# Patient Record
Sex: Female | Born: 1935
Health system: Southern US, Community
[De-identification: ages and names within clinical notes are randomized; demographics above are authoritative.]

## PROBLEM LIST (undated history)

## (undated) DIAGNOSIS — M1990 Unspecified osteoarthritis, unspecified site: Secondary | ICD-10-CM

## (undated) DIAGNOSIS — K219 Gastro-esophageal reflux disease without esophagitis: Secondary | ICD-10-CM

## (undated) DIAGNOSIS — I10 Essential (primary) hypertension: Secondary | ICD-10-CM

## (undated) DIAGNOSIS — F32A Depression, unspecified: Secondary | ICD-10-CM

## (undated) DIAGNOSIS — G5600 Carpal tunnel syndrome, unspecified upper limb: Secondary | ICD-10-CM

## (undated) DIAGNOSIS — F329 Major depressive disorder, single episode, unspecified: Secondary | ICD-10-CM

## (undated) DIAGNOSIS — R351 Nocturia: Secondary | ICD-10-CM

## (undated) HISTORY — PX: APPENDECTOMY: SHX54

## (undated) HISTORY — PX: CATARACT EXTRACTION: SUR2

## (undated) HISTORY — PX: TOTAL HIP ARTHROPLASTY: SHX124

## (undated) HISTORY — PX: CHOLECYSTECTOMY: SHX55

## (undated) HISTORY — PX: TONSILLECTOMY AND ADENOIDECTOMY: SUR1326

## (undated) HISTORY — DX: Unspecified osteoarthritis, unspecified site: M19.90

## (undated) HISTORY — DX: Essential (primary) hypertension: I10

## (undated) HISTORY — PX: TOTAL KNEE ARTHROPLASTY: SHX125

## (undated) HISTORY — DX: Gastro-esophageal reflux disease without esophagitis: K21.9

---

## 1999-07-14 ENCOUNTER — Encounter: Payer: Self-pay | Admitting: Emergency Medicine

## 1999-07-14 ENCOUNTER — Emergency Department (HOSPITAL_COMMUNITY): Admission: EM | Admit: 1999-07-14 | Discharge: 1999-07-14 | Payer: Self-pay | Admitting: Emergency Medicine

## 2002-03-02 ENCOUNTER — Encounter: Payer: Self-pay | Admitting: Family Medicine

## 2002-03-02 ENCOUNTER — Encounter: Admission: RE | Admit: 2002-03-02 | Discharge: 2002-03-02 | Payer: Self-pay | Admitting: Family Medicine

## 2002-06-19 ENCOUNTER — Encounter: Admission: RE | Admit: 2002-06-19 | Discharge: 2002-06-19 | Payer: Self-pay | Admitting: Family Medicine

## 2002-06-19 ENCOUNTER — Encounter: Payer: Self-pay | Admitting: Family Medicine

## 2006-08-17 ENCOUNTER — Encounter: Admission: RE | Admit: 2006-08-17 | Discharge: 2006-08-17 | Payer: Self-pay | Admitting: Family Medicine

## 2010-02-12 ENCOUNTER — Inpatient Hospital Stay (HOSPITAL_COMMUNITY): Admission: RE | Admit: 2010-02-12 | Discharge: 2010-02-18 | Payer: Self-pay | Admitting: Orthopedic Surgery

## 2010-08-12 ENCOUNTER — Ambulatory Visit (HOSPITAL_COMMUNITY): Admission: RE | Admit: 2010-08-12 | Discharge: 2010-08-12 | Payer: Self-pay | Admitting: Orthopedic Surgery

## 2010-12-29 LAB — COMPREHENSIVE METABOLIC PANEL
ALT: 21 U/L (ref 0–35)
AST: 19 U/L (ref 0–37)
Alkaline Phosphatase: 76 U/L (ref 39–117)
CO2: 29 mEq/L (ref 19–32)
Calcium: 9.8 mg/dL (ref 8.4–10.5)
GFR calc Af Amer: >60 mL/min (ref >60)
Glucose, Bld: 108 mg/dL — ABNORMAL HIGH (ref 70–99)
Potassium: 3.9 mEq/L (ref 3.5–5.1)
Sodium: 139 mEq/L (ref 135–145)
Total Protein: 7.7 g/dL (ref 6.0–8.3)

## 2010-12-29 LAB — CBC
HCT: 28.3 % — ABNORMAL LOW (ref 36.0–46.0)
HCT: 28.9 % — ABNORMAL LOW (ref 36.0–46.0)
HCT: 32.2 % — ABNORMAL LOW (ref 36.0–46.0)
Hemoglobin: 9.7 g/dL — ABNORMAL LOW (ref 12.0–15.0)
Hemoglobin: 9.9 g/dL — ABNORMAL LOW (ref 12.0–15.0)
Hemoglobin: 9.9 g/dL — ABNORMAL LOW (ref 12.0–15.0)
Hemoglobin: 14.3 g/dL (ref 12.0–15.0)
MCHC: 33.3 g/dL (ref 30.0–36.0)
MCHC: 33.8 g/dL (ref 30.0–36.0)
MCHC: 34.3 g/dL (ref 30.0–36.0)
MCHC: 34 g/dL (ref 30.0–36.0)
MCV: 94.9 fL (ref 78.0–100.0)
MCV: 94.9 fL (ref 78.0–100.0)
MCV: 95.3 fL (ref 78.0–100.0)
MCV: 95.3 fL (ref 78.0–100.0)
MCV: 96.8 fL (ref 78.0–100.0)
Platelets: 177 10*3/uL (ref 150–400)
Platelets: 202 10*3/uL (ref 150–400)
Platelets: 212 10*3/uL (ref 150–400)
Platelets: 252 10*3/uL (ref 150–400)
RBC: 2.97 MIL/uL — ABNORMAL LOW (ref 3.87–5.11)
RBC: 3 MIL/uL — ABNORMAL LOW (ref 3.87–5.11)
RBC: 3.08 MIL/uL — ABNORMAL LOW (ref 3.87–5.11)
RBC: 3.39 MIL/uL — ABNORMAL LOW (ref 3.87–5.11)
RBC: 4.46 MIL/uL (ref 3.87–5.11)
RDW: 13.8 % (ref 11.5–15.5)
RDW: 13.8 % (ref 11.5–15.5)
WBC: 10.7 10*3/uL — ABNORMAL HIGH (ref 4.0–10.5)
WBC: 11.1 10*3/uL — ABNORMAL HIGH (ref 4.0–10.5)
WBC: 11.3 10*3/uL — ABNORMAL HIGH (ref 4.0–10.5)
WBC: 11.9 10*3/uL — ABNORMAL HIGH (ref 4.0–10.5)

## 2010-12-29 LAB — BASIC METABOLIC PANEL
BUN: 4 mg/dL — ABNORMAL LOW (ref 6–23)
BUN: 5 mg/dL — ABNORMAL LOW (ref 6–23)
BUN: 8 mg/dL (ref 6–23)
CO2: 27 mEq/L (ref 19–32)
CO2: 29 mEq/L (ref 19–32)
CO2: 33 mEq/L — ABNORMAL HIGH (ref 19–32)
CO2: 35 mEq/L — ABNORMAL HIGH (ref 19–32)
Calcium: 8.1 mg/dL — ABNORMAL LOW (ref 8.4–10.5)
Calcium: 8.5 mg/dL (ref 8.4–10.5)
Calcium: 8.5 mg/dL (ref 8.4–10.5)
Chloride: 104 mEq/L (ref 96–112)
Chloride: 104 mEq/L (ref 96–112)
Creatinine, Ser: 0.63 mg/dL (ref 0.4–1.2)
Creatinine, Ser: 0.7 mg/dL (ref 0.4–1.2)
GFR calc Af Amer: >60 mL/min (ref >60)
GFR calc Af Amer: >60 mL/min (ref >60)
GFR calc non Af Amer: 59 mL/min — ABNORMAL LOW (ref >60)
GFR calc non Af Amer: >60 mL/min (ref >60)
GFR calc non Af Amer: >60 mL/min (ref >60)
GFR calc non Af Amer: >60 mL/min (ref >60)
GFR calc non Af Amer: >60 mL/min (ref >60)
Glucose, Bld: 121 mg/dL — ABNORMAL HIGH (ref 70–99)
Glucose, Bld: 123 mg/dL — ABNORMAL HIGH (ref 70–99)
Glucose, Bld: 179 mg/dL — ABNORMAL HIGH (ref 70–99)
Potassium: 3.8 mEq/L (ref 3.5–5.1)
Potassium: 4.5 mEq/L (ref 3.5–5.1)
Sodium: 137 mEq/L (ref 135–145)
Sodium: 140 mEq/L (ref 135–145)

## 2010-12-29 LAB — URINALYSIS, ROUTINE W REFLEX MICROSCOPIC
Bilirubin Urine: NEGATIVE
Glucose, UA: NEGATIVE mg/dL
Ketones, ur: 15 mg/dL — AB
Protein, ur: NEGATIVE mg/dL
pH: 7 (ref 5.0–8.0)

## 2010-12-29 LAB — PROTIME-INR
INR: 1.15 (ref 0.00–1.49)
INR: 1.75 — ABNORMAL HIGH (ref 0.00–1.49)
INR: 2.73 — ABNORMAL HIGH (ref 0.00–1.49)
Prothrombin Time: 14.6 seconds (ref 11.6–15.2)
Prothrombin Time: 20.3 seconds — ABNORMAL HIGH (ref 11.6–15.2)
Prothrombin Time: 24.2 seconds — ABNORMAL HIGH (ref 11.6–15.2)
Prothrombin Time: 24.7 seconds — ABNORMAL HIGH (ref 11.6–15.2)
Prothrombin Time: 12.8 seconds (ref 11.6–15.2)

## 2010-12-29 LAB — AMYLASE: Amylase: 21 U/L (ref 0–105)

## 2010-12-29 LAB — URINE MICROSCOPIC-ADD ON

## 2010-12-29 LAB — TYPE AND SCREEN
ABO/RH(D): B POS
Antibody Screen: NEGATIVE

## 2011-06-18 IMAGING — CR DG HIP COMPLETE 2+V*R*
3 series · 3 of 3 positions shown · non-contrast
Comparison: None.

CLINICAL DATA: Preop for osteoarthritis

RIGHT HIP - COMPLETE 2+ VIEW

[t pelvis a.p. *]
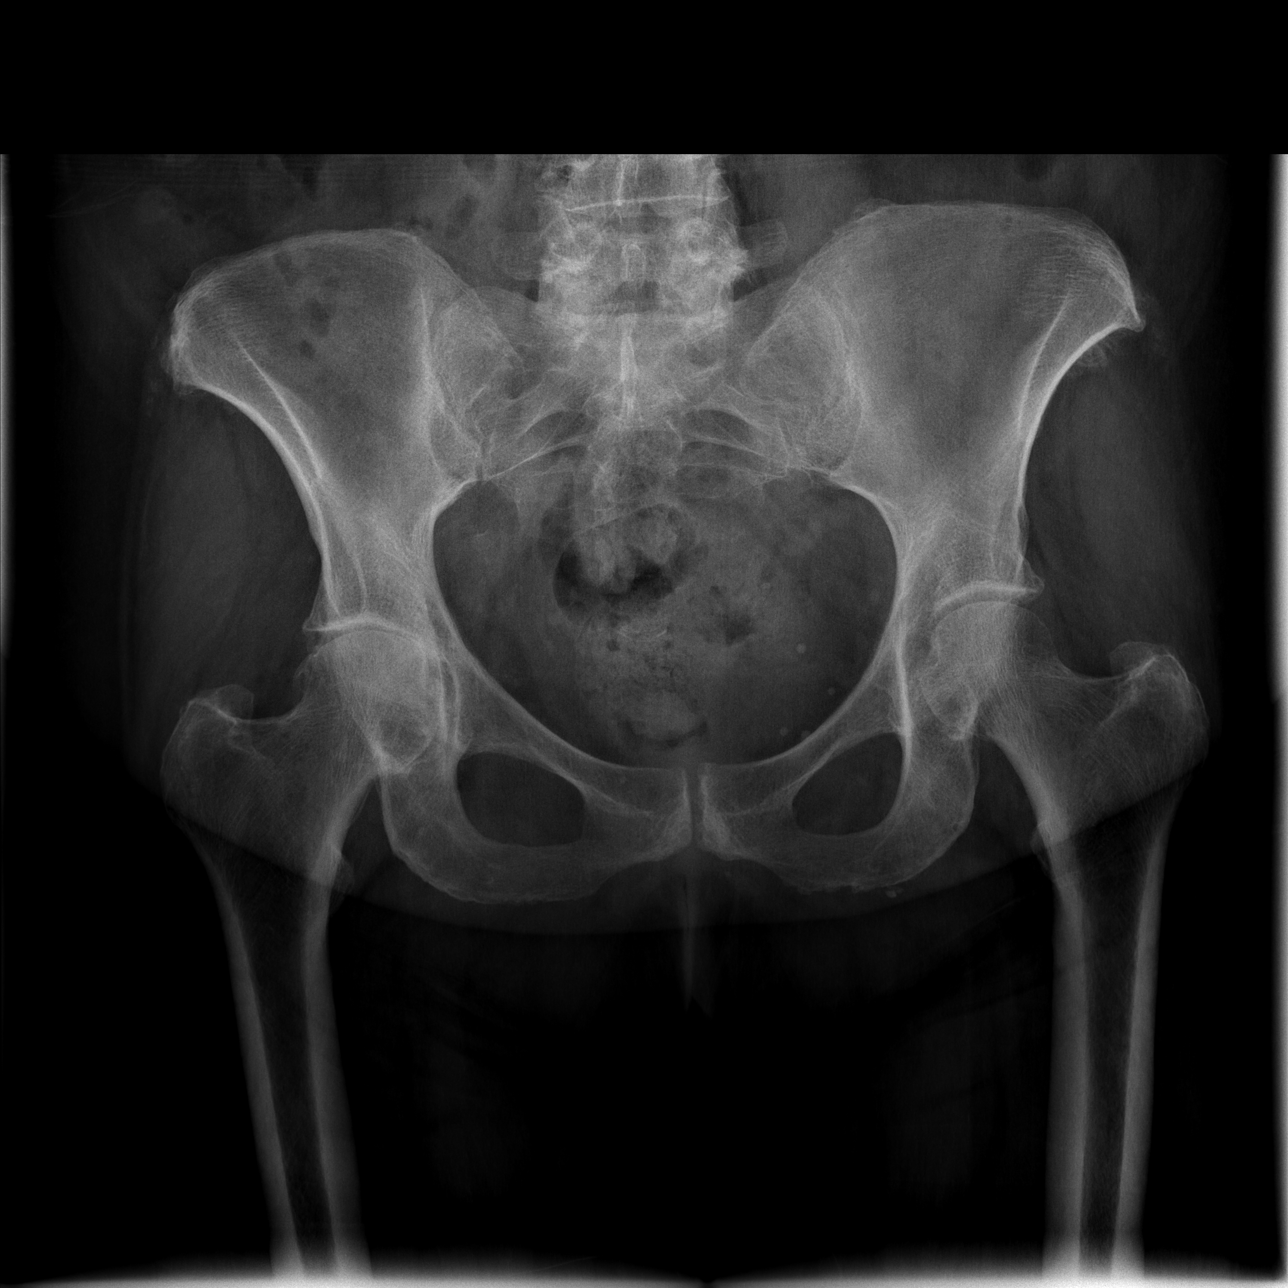

[t hip ap right *]
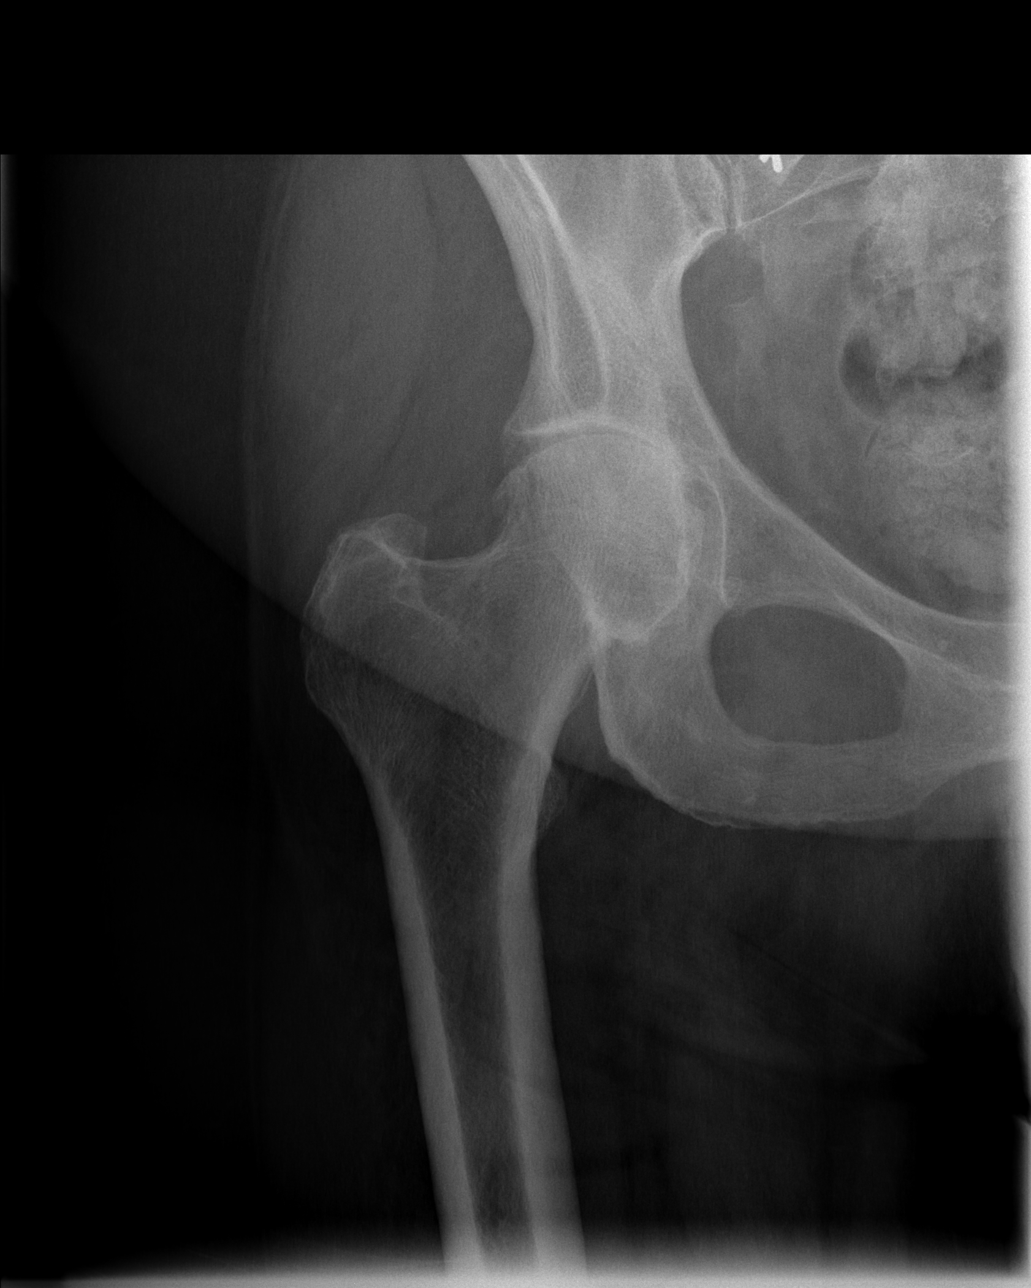

[t hip frog leg right *]
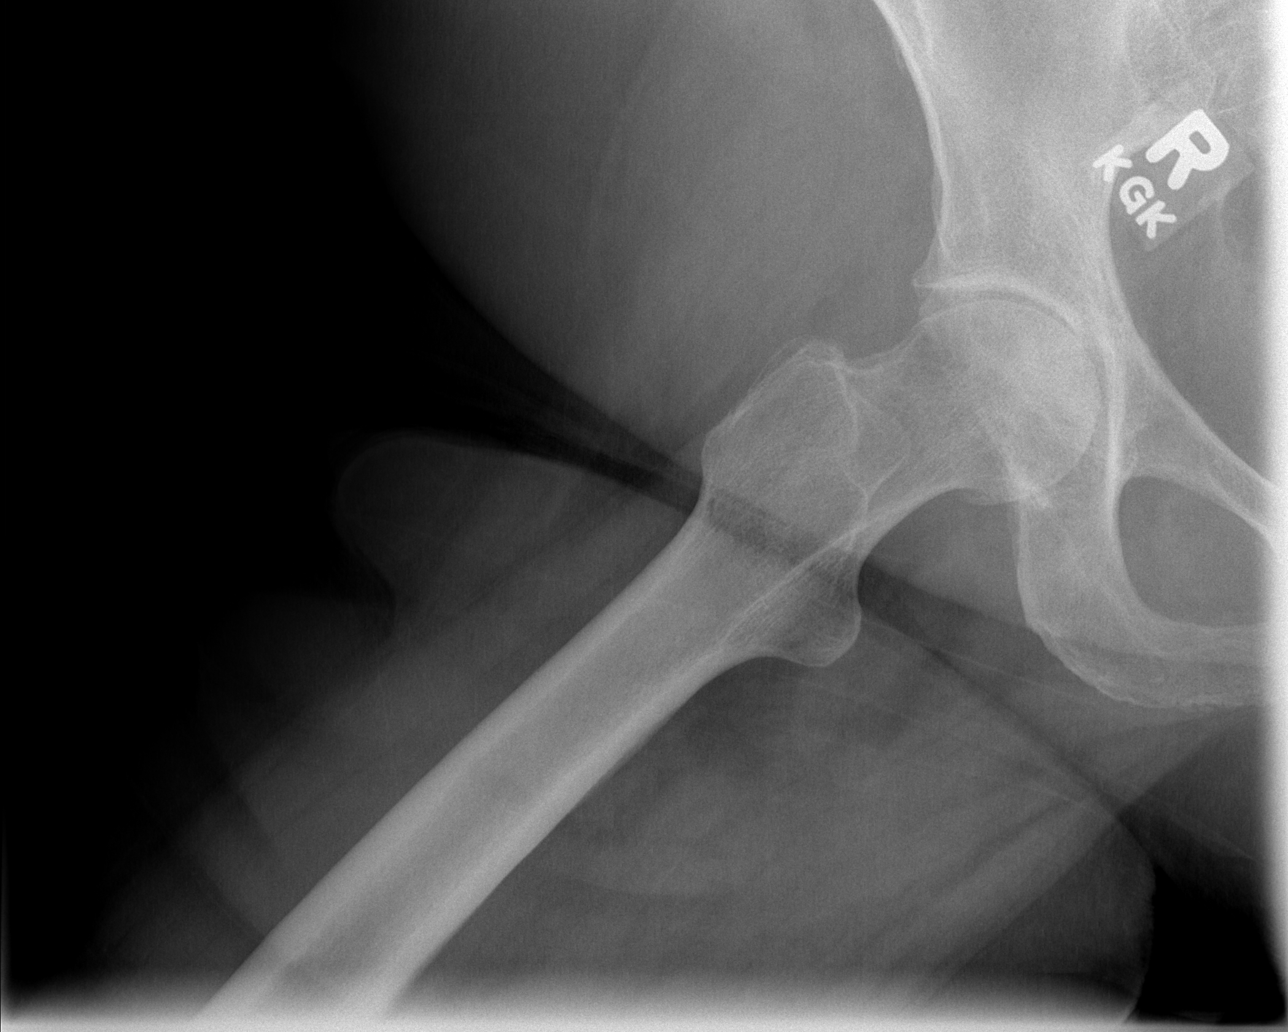

[3 of 3 positions shown; findings below may reference images not displayed]

FINDINGS: Three views of the right hip submitted.  No acute
fracture or subluxation.  Diffuse narrowing of the joint space is
noted.  Mild spurring of the right femoral head.  Minimal
remodeling of the right femoral head is noted.  Pelvic phleboliths
are noted.
IMPRESSION: No acute fracture or subluxation.  Diffuse narrowing of the joint
space right hip joint.  Mild spurring of the right femoral head.

## 2011-06-26 IMAGING — CR DG HIP 1V PORT*R*
1 series · 1 of 1 positions shown · non-contrast
Comparison: 02/04/2010

CLINICAL DATA: Right hip replacement.

PORTABLE RIGHT HIP - 1 VIEW

[view not recorded]
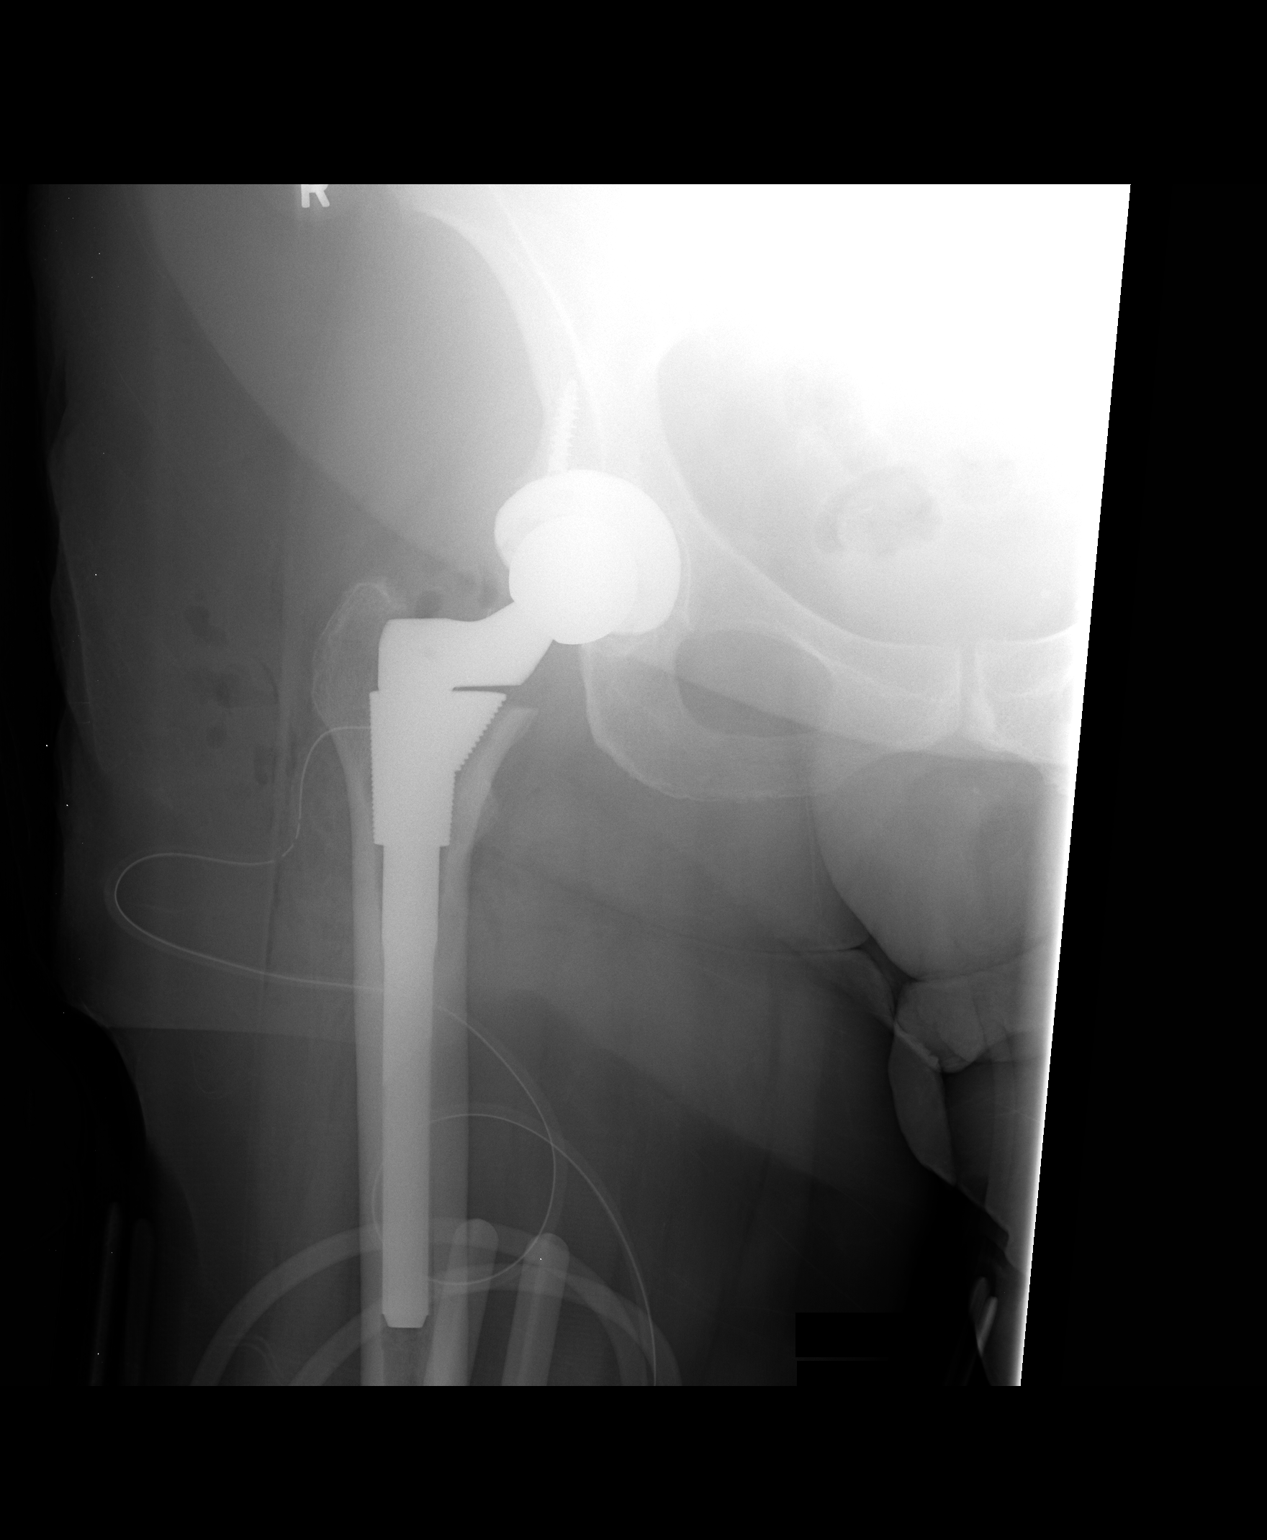

[1 of 1 positions shown; findings below may reference images not displayed]

FINDINGS: Right total hip replacement is in satisfactory position
and alignment.  There is no fracture or acute complication.
IMPRESSION: Satisfactory right hip replacement.

## 2014-11-19 ENCOUNTER — Ambulatory Visit: Payer: Self-pay | Admitting: Orthopedic Surgery

## 2014-11-19 NOTE — Progress Notes (Signed)
Preoperative surgical orders have been place into the Epic hospital system for Melissa Harris on 11/19/2014, 11:51 AM  by Patrica DuelPERKINS, ALEXZANDREW for surgery on 12-20-2014.  Preop Total Knee orders including Experal, IV Tylenol, and IV Decadron as long as there are no contraindications to the above medications. Avel Peacerew Perkins, PA-C

## 2014-11-28 ENCOUNTER — Telehealth: Payer: Self-pay | Admitting: Cardiology

## 2014-11-28 ENCOUNTER — Encounter: Payer: Self-pay | Admitting: Cardiology

## 2014-11-28 ENCOUNTER — Ambulatory Visit (INDEPENDENT_AMBULATORY_CARE_PROVIDER_SITE_OTHER): Payer: Medicare Other | Admitting: Cardiology

## 2014-11-28 VITALS — BP 140/80 | HR 68 | Ht 63.0 in | Wt 182.0 lb

## 2014-11-28 DIAGNOSIS — R002 Palpitations: Secondary | ICD-10-CM | POA: Insufficient documentation

## 2014-11-28 DIAGNOSIS — Z01810 Encounter for preprocedural cardiovascular examination: Secondary | ICD-10-CM

## 2014-11-28 NOTE — Patient Instructions (Signed)
Your physician recommends that you schedule a follow-up appointment in: as needed with Dr. Hochrein  

## 2014-11-28 NOTE — Telephone Encounter (Signed)
[facility] for appointment with Dr Antoine PocheHochrein on 11/28/14.  [facility] Dr Lindaann SloughHochreins schedule 11/28/14. lp

## 2014-11-28 NOTE — Progress Notes (Signed)
Cardiology Office Note   Date:  11/28/2014   ID:  [name], DOB 1928-04-19, MRN [id number]  PCP:  No primary care provider on file.  Cardiologist:   Rollene RotundaJames Francisca Harbuck, MD   No chief complaint on file.     History of Present Illness: Melissa Harris is a 81 y.o. female who presents for preoperative evaluation prior to orthopedic surgery. She has no past cardiac history.  She however a couple of months ago felt a strong heartbeat in her throat. This happened when she was lying down at night. It occurred for about 3 hours. It was not rapid or irregular but she felt something pulsing up her neck. It came and went spontaneously. She has never had this before and not since.  She denies any chest pressure, neck or arm discomfort. She doesn't have a shortness of breath, PND or orthopnea. She doesn't have any presyncope or syncope. She gets around slowly with her orthopedic problems but she exercises 3 times per week in her community with other retirees and she has no limitations with this.   Past Medical History  Diagnosis Date  . Osteoarthritis   . GERD (gastroesophageal reflux disease)     Past Surgical History  Procedure Laterality Date  . Total hip arthroplasty Right   . Total knee arthroplasty Right   . Cholecystectomy    . Appendectomy    . Tonsillectomy and adenoidectomy    . Cataract extraction       Current Outpatient Prescriptions  Medication Sig Dispense Refill  . aspirin 81 MG tablet Take 81 mg by mouth daily.    . calcium carbonate (TUMS - DOSED IN MG ELEMENTAL CALCIUM) 500 MG chewable tablet Chew 1 tablet by mouth 3 (three) times daily.    . Cholecalciferol (VITAMIN D3) 1000 UNITS CAPS Take 2,000 Int'l Units by mouth daily.    Marland Kitchen. latanoprost (XALATAN) 0.005 % ophthalmic solution 1 drop at bedtime.    . metoprolol tartrate (LOPRESSOR) 25 MG tablet Take 25 mg by mouth 3 (three) times daily. 1 in the morning and two in the evening    . Multiple Vitamin (MULTIVITAMIN)  capsule Take 1 capsule by mouth daily.    . naproxen sodium (ANAPROX) 220 MG tablet Take 220 mg by mouth 2 (two) times daily with a meal.     No current facility-administered medications for this visit.    Allergies:   Review of patient's allergies indicates not on file.    Social History:  The patient  reports that she has never smoked. She does not have any smokeless tobacco history on file.   Family History:  The patient's family history includes Dementia in an other family member.    ROS:  Please see the history of present illness.   Otherwise, review of systems are positive for decreased hearing, reflux, mild difficulty swallowing, occasional urinary incontinence..   All other systems are reviewed and negative.    PHYSICAL EXAM: VS:  BP 140/80 mmHg  Pulse 68  Ht 5\' 3"  (1.6 m)  Wt 182 lb (82.555 kg)  BMI 32.25 kg/m2 , BMI Body mass index is 32.25 kg/(m^2). GENERAL:  Well appearing HEENT:  Pupils equal round and reactive, fundi not visualized, oral mucosa unremarkable NECK:  No jugular venous distention, waveform within normal limits, carotid upstroke brisk and symmetric, no bruits, no thyromegaly LYMPHATICS:  No cervical, inguinal adenopathy LUNGS:  Clear to auscultation bilaterally BACK:  No CVA tenderness CHEST:  Unremarkable HEART:  PMI not displaced or sustained,S1 and S2 within normal limits, no S3, no S4, no clicks, no rubs, 2 out of 6 right upper sternal border nonradiating systolic murmur, no diastolic murmurs ABD:  Flat, positive bowel sounds normal in frequency in pitch, no bruits, no rebound, no guarding, no midline pulsatile mass, no hepatomegaly, no splenomegaly EXT:  2 plus pulses throughout, no edema, no cyanosis no clubbing SKIN:  No rashes no nodules NEURO:  Cranial nerves II through XII grossly intact, motor grossly intact throughout PSYCH:  Cognitively intact, oriented to person place and time    EKG:  EKG is ordered today. The ekg ordered today  demonstrates sinus rhythm, rate 68, leftward axis, RSR prime V1 and V2, nonspecific diffuse T wave flattening     Wt Readings from Last 3 Encounters:  11/28/14 182 lb (82.555 kg)      Other studies Reviewed: Additional studies/ records that were reviewed today include: Outside office note. Review of the above records demonstrates:  Please see elsewhere in the note.     ASSESSMENT AND PLAN:  PREOP:  The patient has a high functional status. She is not going for a high risk procedure. There are no high risk symptoms or findings. Therefore, based on ACC/AHA guidelines, the patient would be at acceptable risk for the planned procedure without further cardiovascular testing.  PALPITATIONS:  The patient describes a one time episode of some vague palpitations but otherwise has no symptoms. Therefore, no further cardiovascular testing is indicated at this point. However, be happy to reevaluate should she have recurrence of the symptoms.  HTN:  She reports that her blood pressure is well controlled. She actually does not report this as a previous diagnosis. I did review some blood pressures and though it fluctuates somewhat the average seems to be about 130 - 140 systolic.   Current medicines are reviewed at length with the patient today.  The patient does not have concerns regarding medicines.  The following changes have been made:  no change  Labs/ tests ordered today include:   Orders Placed This Encounter  Procedures  . EKG 12-Lead     Disposition:   FU with me as needed.     Signed, Rollene Rotunda, MD  11/28/2014 4:21 PM     [facility]

## 2014-12-09 NOTE — Patient Instructions (Addendum)
Melissa Harris  12/09/2014   Your procedure is scheduled on: 12/20/2014    [facility] and follow signs to               Short Stay Center at     0515 AM.  Call this number if you have problems the morning of surgery [phone]   Remember:  Do not eat food or drink liquids :After Midnight.     Take these medicines the morning of surgery with A SIP OF WATER: Metoprolol ( Lopressor )                                You may not have any metal on your body including hair pins and              piercings  Do not wear jewelry, make-up, lotions, powders or perfumes., deodorant.               Do not wear nail polish.  Do not shave  48 hours prior to surgery.                Do not bring valuables to the hospital. [facility] IS NOT             RESPONSIBLE   FOR VALUABLES.  Contacts, dentures or bridgework may not be worn into surgery.  Leave suitcase in the car. After surgery it may be brought to your room.     Special Instructions: coughing and deep breathing exercises, leg exercises               Please read over the following fact sheets you were given: _____________________________________________________________________             [facility] - Preparing [facility] Before surgery, you can play an important role.  Because skin is not sterile, your skin needs to be as free of germs as possible.  You can reduce the number of germs on your skin by washing with CHG (chlorahexidine gluconate) soap before surgery.  CHG is an antiseptic cleaner which kills germs and bonds with the skin to continue killing germs even after washing. Please DO NOT use if you have an allergy to CHG or antibacterial soaps.  If your skin becomes reddened/irritated stop using the CHG and inform your nurse when you arrive at Short Stay. Do not shave (including legs and underarms) for at least 48 hours prior to the first CHG shower.  You may shave your  face/neck. Please follow these instructions carefully:  1.  Shower with CHG Soap the night before surgery and the  morning of Surgery.  2.  If you choose to wash your hair, wash your hair first as usual with your  normal  shampoo.  3.  After you shampoo, rinse your hair and body thoroughly to remove the  shampoo.                           4.  Use CHG as you would any other liquid soap.  You can apply chg directly  to the skin and wash                       Gently with a scrungie or clean washcloth.  5.  Apply the CHG Soap to your body ONLY FROM THE NECK DOWN.   Do not use on face/ open                           Wound or open sores. Avoid contact with eyes, ears mouth and genitals (private parts).                       Wash face,  Genitals (private parts) with your normal soap.             6.  Wash thoroughly, paying special attention to the area where your surgery  will be performed.  7.  Thoroughly rinse your body with warm water from the neck down.  8.  DO NOT shower/wash with your normal soap after using and rinsing off  the CHG Soap.                9.  Pat yourself dry with a clean towel.            10.  Wear clean pajamas.            11.  Place clean sheets on your bed the night of your first shower and do not  sleep with pets. Day of Surgery : Do not apply any lotions/deodorants the morning of surgery.  Please wear clean clothes to the hospital/surgery center.  FAILURE TO FOLLOW THESE INSTRUCTIONS MAY RESULT IN THE CANCELLATION OF YOUR SURGERY PATIENT SIGNATURE_________________________________  NURSE SIGNATURE__________________________________  ________________________________________________________________________  WHAT IS A BLOOD TRANSFUSION? Blood Transfusion Information  A transfusion is the replacement of blood or some of its parts. Blood is made up of multiple cells which provide different functions.  Red blood cells carry oxygen and are used for blood loss  replacement.  White blood cells fight against infection.  Platelets control bleeding.  Plasma helps clot blood.  Other blood products are available for specialized needs, such as hemophilia or other clotting disorders. BEFORE THE TRANSFUSION  Who gives blood for transfusions?   Healthy volunteers who are fully evaluated to make sure their blood is safe. This is blood bank blood. Transfusion therapy is the safest it has ever been in the practice of medicine. Before blood is taken from a donor, a complete history is taken to make sure that person has no history of diseases nor engages in risky social behavior (examples are intravenous drug use or sexual activity with multiple partners). The donor's travel history is screened to minimize risk of transmitting infections, such as malaria. The donated blood is tested for signs of infectious diseases, such as HIV and hepatitis. The blood is then tested to be sure it is compatible with you in order to minimize the chance of a transfusion reaction. If you or a relative donates blood, this is often done in anticipation of surgery and is not appropriate for emergency situations. It takes many days to process the donated blood. RISKS AND COMPLICATIONS Although transfusion therapy is very safe and saves many lives, the main dangers of transfusion include:  1. Getting an infectious disease. 2. Developing a transfusion reaction. This is an allergic reaction to something in the blood you were given. Every precaution is taken to prevent this. The decision to have a blood transfusion has been considered carefully by your caregiver before blood is given. Blood is not given unless the benefits outweigh the risks. AFTER THE TRANSFUSION  Right after receiving a  blood transfusion, you will usually feel much better and more energetic. This is especially true if your red blood cells have gotten low (anemic). The transfusion raises the level of the red blood cells which  carry oxygen, and this usually causes an energy increase.  The nurse administering the transfusion will monitor you carefully for complications. HOME CARE INSTRUCTIONS  No special instructions are needed after a transfusion. You may find your energy is better. Speak with your caregiver about any limitations on activity for underlying diseases you may have. SEEK MEDICAL CARE IF:   Your condition is not improving after your transfusion.  You develop redness or irritation at the intravenous (IV) site. SEEK IMMEDIATE MEDICAL CARE IF:  Any of the following symptoms occur over the next 12 hours:  Shaking chills.  You have a temperature by mouth above 102 F (38.9 C), not controlled by medicine.  Chest, back, or muscle pain.  People around you feel you are not acting correctly or are confused.  Shortness of breath or difficulty breathing.  Dizziness and fainting.  You get a rash or develop hives.  You have a decrease in urine output.  Your urine turns a dark color or changes to pink, red, or brown. Any of the following symptoms occur over the next 10 days:  You have a temperature by mouth above 102 F (38.9 C), not controlled by medicine.  Shortness of breath.  Weakness after normal activity.  The white part of the eye turns yellow (jaundice).  You have a decrease in the amount of urine or are urinating less often.  Your urine turns a dark color or changes to pink, red, or brown. Document Released: 09/24/2000 Document Revised: 12/20/2011 Document Reviewed: 05/13/2008 Exit[employer] Patient Information 2014 [place].  _______________________________________________________________________  Incentive Spirometer  An incentive spirometer is a tool that can help keep your lungs clear and active. This tool measures how well you are filling your lungs with each breath. Taking long deep breaths may help reverse or decrease the chance of developing breathing (pulmonary) problems  (especially infection) following:  A long period of time when you are unable to move or be active. BEFORE THE PROCEDURE   If the spirometer includes an indicator to show your best effort, your nurse or respiratory therapist will set it to a desired goal.  If possible, sit up straight or lean slightly forward. Try not to slouch.  Hold the incentive spirometer in an upright position. INSTRUCTIONS FOR USE  3. Sit on the edge of your bed if possible, or sit up as far as you can in bed or on a chair. 4. Hold the incentive spirometer in an upright position. 5. Breathe out normally. 6. Place the mouthpiece in your mouth and seal your lips tightly around it. 7. Breathe in slowly and as deeply as possible, raising the piston or the ball toward the top of the column. 8. Hold your breath for 3-5 seconds or for as long as possible. Allow the piston or ball to fall to the bottom of the column. 9. Remove the mouthpiece from your mouth and breathe out normally. 10. Rest for a few seconds and repeat Steps 1 through 7 at least 10 times every 1-2 hours when you are awake. Take your time and take a few normal breaths between deep breaths. 11. The spirometer may include an indicator to show your best effort. Use the indicator as a goal to work toward during each repetition. 12. After each set of 10  deep breaths, practice coughing to be sure your lungs are clear. If you have an incision (the cut made at the time of surgery), support your incision when coughing by placing a pillow or rolled up towels firmly against it. Once you are able to get out of bed, walk around indoors and cough well. You may stop using the incentive spirometer when instructed by your caregiver.  RISKS AND COMPLICATIONS  Take your time so you do not get dizzy or light-headed.  If you are in pain, you may need to take or ask for pain medication before doing incentive spirometry. It is harder to take a deep breath if you are having  pain. AFTER USE  Rest and breathe slowly and easily.  It can be helpful to keep track of a log of your progress. Your caregiver can provide you with a simple table to help with this. If you are using the spirometer at home, follow these instructions: [place] IF:   You are having difficultly using the spirometer.  You have trouble using the spirometer as often as instructed.  Your pain medication is not giving enough relief while using the spirometer.  You develop fever of 100.5 F (38.1 C) or higher. SEEK IMMEDIATE MEDICAL CARE IF:   You cough up bloody sputum that had not been present before.  You develop fever of 102 F (38.9 C) or greater.  You develop worsening pain at or near the incision site. MAKE SURE YOU:   Understand these instructions.  Will watch your condition.  Will get help right away if you are not doing well or get worse. Document Released: 02/07/2007 Document Revised: 12/20/2011 Document Reviewed: 04/10/2007 [facility] Patient Information 2014 [place], [place].   ________________________________________________________________________

## 2014-12-10 ENCOUNTER — Encounter (HOSPITAL_COMMUNITY): Payer: Self-pay

## 2014-12-10 ENCOUNTER — Encounter (HOSPITAL_COMMUNITY)
Admission: RE | Admit: 2014-12-10 | Discharge: 2014-12-10 | Disposition: A | Discharge status: Home / Self Care | Payer: Medicare Other | Source: Ambulatory Visit | Attending: Orthopedic Surgery | Admitting: Orthopedic Surgery

## 2014-12-10 DIAGNOSIS — Z01812 Encounter for preprocedural laboratory examination: Secondary | ICD-10-CM | POA: Insufficient documentation

## 2014-12-10 DIAGNOSIS — M1712 Unilateral primary osteoarthritis, left knee: Secondary | ICD-10-CM | POA: Insufficient documentation

## 2014-12-10 HISTORY — DX: Nocturia: R35.1

## 2014-12-10 HISTORY — DX: Carpal tunnel syndrome, unspecified upper limb: G56.00

## 2014-12-10 HISTORY — DX: Depression, unspecified: F32.A

## 2014-12-10 HISTORY — DX: Major depressive disorder, single episode, unspecified: F32.9

## 2014-12-10 LAB — URINALYSIS, ROUTINE W REFLEX MICROSCOPIC
BILIRUBIN URINE: NEGATIVE
GLUCOSE, UA: NEGATIVE mg/dL
KETONES UR: NEGATIVE mg/dL
Leukocytes, UA: NEGATIVE
Nitrite: NEGATIVE
PH: 6.5 (ref 5.0–8.0)
Protein, ur: NEGATIVE mg/dL
Specific Gravity, Urine: 1.022 (ref 1.005–1.030)
Urobilinogen, UA: 0.2 mg/dL (ref 0.0–1.0)

## 2014-12-10 LAB — COMPREHENSIVE METABOLIC PANEL
ALBUMIN: 4.2 g/dL (ref 3.5–5.2)
ALT: 16 U/L (ref 0–35)
AST: 19 U/L (ref 0–37)
Alkaline Phosphatase: 77 U/L (ref 39–117)
Anion gap: 8 (ref 5–15)
BUN: 16 mg/dL (ref 6–23)
CO2: 29 mmol/L (ref 19–32)
Calcium: 9.7 mg/dL (ref 8.4–10.5)
Chloride: 102 mmol/L (ref 96–112)
Creatinine, Ser: 1.08 mg/dL (ref 0.50–1.10)
GFR calc non Af Amer: 45 mL/min — ABNORMAL LOW (ref >90)
GFR, EST AFRICAN AMERICAN: 53 mL/min — AB (ref >90)
Glucose, Bld: 103 mg/dL — ABNORMAL HIGH (ref 70–99)
POTASSIUM: 3.9 mmol/L (ref 3.5–5.1)
SODIUM: 139 mmol/L (ref 135–145)
TOTAL PROTEIN: 7.8 g/dL (ref 6.0–8.3)
Total Bilirubin: 0.9 mg/dL (ref 0.3–1.2)

## 2014-12-10 LAB — URINE MICROSCOPIC-ADD ON

## 2014-12-10 LAB — CBC
HCT: 43.7 % (ref 36.0–46.0)
HEMOGLOBIN: 14.4 g/dL (ref 12.0–15.0)
MCH: 31.4 pg (ref 26.0–34.0)
MCHC: 33 g/dL (ref 30.0–36.0)
MCV: 95.4 fL (ref 78.0–100.0)
Platelets: 261 10*3/uL (ref 150–400)
RBC: 4.58 MIL/uL (ref 3.87–5.11)
RDW: 12.8 % (ref 11.5–15.5)
WBC: 8.4 10*3/uL (ref 4.0–10.5)

## 2014-12-10 LAB — SURGICAL PCR SCREEN
MRSA, PCR: NEGATIVE
Staphylococcus aureus: NEGATIVE

## 2014-12-10 LAB — PROTIME-INR
INR: 1.03 (ref 0.00–1.49)
Prothrombin Time: 13.6 seconds (ref 11.6–15.2)

## 2014-12-10 LAB — APTT: APTT: 27 s (ref 24–37)

## 2014-12-10 NOTE — Progress Notes (Deleted)
EKG- 08/20/14 EPIC  ECHO- 09/12/14 EPIC Stress 09/12/14 EPIC  LOV with Dr Bishop Limbo Kelly 11/27/14 EPIC

## 2014-12-10 NOTE — Progress Notes (Signed)
EKG- 11/28/14 EPIC  CXR- 10/22/14 EPIC  LOV with Dr Antoine PocheHochrein for preop clearance- 11/28/14 EPIC  Surgical clearance- Dr Betty SwazilandJordan on chart.

## 2014-12-19 ENCOUNTER — Ambulatory Visit (INDEPENDENT_AMBULATORY_CARE_PROVIDER_SITE_OTHER): Payer: Medicare Other | Admitting: Orthopedic Surgery

## 2014-12-19 MED ORDER — TRANEXAMIC ACID 100 MG/ML IV SOLN
1000.0000 mg | INTRAVENOUS | Status: AC
  Administered 2014-12-20: 1000 mg via INTRAVENOUS
  Filled 2014-12-19: qty 10

## 2014-12-19 NOTE — H&P (Signed)
Melissa Harris DOB: 12/28/1927 Widowed / Language: English / Race: White Female Date of Admission:  12/20/2014 CC: Left Knee Pain History of Present Illness The patient is a 81 year old female who comes in for a preoperative History and Physical. The patient is scheduled for a left total knee arthroplasty to be performed by Dr. Rosenda Geffrard V. Halle Davlin, MD at [facility] on 12-20-2014. The patient is a 81 year old female who presents for follow up of their knee. The patient is being followed for their left knee pain. They are now several months out from last left knee injection. Symptoms reported include: pain, aching, catching, instability and difficulty ambulating. The patient feels that they are doing poorly and report their pain level to be moderate to severe. Current treatment includes: NSAIDs. The following medication has been used for pain control: antiinflammatory medication (Aleve). The patient has reported improvement of their symptoms with: Cortisone injections. She has continued to have pain despite conservative measures and is now ready to proceed with surgery for the left knee. They have been treated conservatively in the past for the above stated problem and despite conservative measures, they continue to have progressive pain and severe functional limitations and dysfunction. They have failed non-operative management including home exercise, medications, and injections. It is felt that they would benefit from undergoing total joint replacement. Risks and benefits of the procedure have been discussed with the patient and they elect to proceed with surgery. There are no active contraindications to surgery such as ongoing infection or rapidly progressive neurological disease.  Problem List/Past Medical (Alexzandrew L Perkins, III PA-C; 11/29/2014 3:21 PM) Primary osteoarthritis of left knee (M17.12) Status post total right knee replacement  (Z96.651) Osteoarthritis Fibromyalgia Depression Gastroesophageal Reflux Disease High blood pressure Impaired Hearing Right Ear  Allergies TIMOPTIC  Family History (Alexzandrew L Perkins, III PA-C; 11/29/2014 3:15 PM) Osteoarthritis mother Asthma Dementia Mother Deceased, Dementia. age 93 Father Deceased. age 92  Social History (Alexzandrew L Perkins, III PA-C; 11/29/2014 3:15 PM) Pain Contract no Marital status widowed Illicit drug use no Tobacco use never smoker Tobacco / smoke exposure no Children 5 or more Alcohol use never consumed alcohol Current work status retired Exercise Exercises daily; does other Drug/Alcohol Rehab (Previously) no Drug/Alcohol Rehab (Currently) no Living situation live alone Post-Surgical Plans Rehab  Medication History (Alexzandrew L Perkins, III PA-C; 11/29/2014 3:09 PM) Latanoprost (0.005% Solution, Ophthalmic) Active. Aleve (220MG Tablet, Oral) Active. Tums (Oral) Specific dose unknown - Active. Metoprolol Tartrate (25MG Tablet, Oral) Active. Aspirin (Oral) Specific dose unknown - Active. Vitamin D3 (Oral) Specific dose unknown - Active. Vitamin B12 (Oral) Specific dose unknown - Active.  Past Surgical History (Alexzandrew L Perkins, III PA-C; 11/29/2014 3:07 PM) Gallbladder Surgery open Cataract Surgery bilateral Total Hip Replacement right Total Knee Replacement right Appendectomy Tonsillectomy  Review of Systems (Alexzandrew L. Perkins III PA-C; 11/29/2014 3:21 PM) General Not Present- Chills, Fatigue, Fever, Memory Loss, Night Sweats, Weight Gain and Weight Loss. Skin Not Present- Eczema, Hives, Itching, Lesions and Rash. HEENT Not Present- Dentures, Double Vision, Headache, Hearing Loss, Tinnitus and Visual Loss. Respiratory Not Present- Allergies, Chronic Cough, Coughing up blood, Shortness of breath at rest and Shortness of breath with exertion. Cardiovascular Not Present- Chest Pain,  Difficulty Breathing Lying Down, Murmur, Palpitations, Racing/skipping heartbeats and Swelling. Gastrointestinal Not Present- Abdominal Pain, Bloody Stool, Constipation, Diarrhea, Difficulty Swallowing, Heartburn, Jaundice, Loss of appetitie, Nausea and Vomiting. Female Genitourinary Present- Urinating at Night. Not Present- Blood in Urine, Discharge, Flank Pain,   Incontinence, Painful Urination, Urgency, Urinary frequency, Urinary Retention and Weak urinary stream. Musculoskeletal Present- Joint Pain. Not Present- Back Pain, Joint Swelling, Morning Stiffness, Muscle Pain, Muscle Weakness and Spasms. Neurological Not Present- Blackout spells, Difficulty with balance, Dizziness, Paralysis, Tremor and Weakness. Psychiatric Not Present- Insomnia.   Vitals (Alexzandrew L. Perkins III PA-C; 11/29/2014 3:19 PM) 11/29/2014 3:15 PM Weight: 193 lb Height: 63.5in Weight was reported by patient. Height was reported by patient. Body Surface Area: 1.92 m Body Mass Index: 33.65 kg/m  BP: 158/80 (Sitting, Right Arm, Standard)  Physical Exam (Alexzandrew L. Perkins III PA-C; 11/29/2014 3:22 PM) General Mental Status -Alert, cooperative and good historian. General Appearance-pleasant, Not in acute distress. Orientation-Oriented X3. Build & Nutrition-Well nourished and Well developed.  Head and Neck Head-normocephalic, atraumatic . Neck Global Assessment - supple, no bruit auscultated on the right, no bruit auscultated on the left.  Eye Vision-Wears corrective lenses(bifocals). Pupil - Bilateral-Regular and Round. Motion - Bilateral-EOMI.  Chest and Lung Exam Auscultation Breath sounds - clear at anterior chest wall and clear at posterior chest wall. Adventitious sounds - No Adventitious sounds.  Cardiovascular Auscultation Rhythm - Regular rate and rhythm. Heart Sounds - S1 WNL and S2 WNL. Murmurs & Other Heart Sounds: Murmur 1 - Location - Aortic Area. Timing - Early  systolic. Grade - II/VI. Character - Low pitched.  Abdomen Palpation/Percussion Tenderness - Abdomen is non-tender to palpation. Rigidity (guarding) - Abdomen is soft. Auscultation Auscultation of the abdomen reveals - Bowel sounds normal.  Female Genitourinary Note: Not done, not pertinent to present illness   Musculoskeletal Note: Examination reveals a well-developed female in no distress. The left knee shows no effusion. Range is about 10 to 120. There is moderate crepitus on range of motion, with some tenderness medial greater than lateral, with no instability noted.   Assessment & Plan (Alexzandrew L. Perkins III PA-C; 12/08/2014 5:20 PM) Primary osteoarthritis of left knee (M17.12) Note:Surgical Plans: Left Total Knee Repalcement  Disposition: Rehab, she lives alone.  PCP: Dr. Betty Jordan - Patient has been seen preoperatively and felt to be stable for surgery. Cardiology: Dr. Hocrein - Patient has been seen preoperatively and felt to be stable for surgery.  IV TXA  Anesthesia Issues: None  Signed electronically by Alexzandrew L Perkins, III PA-C 

## 2014-12-19 NOTE — Anesthesia Preprocedure Evaluation (Signed)
Anesthesia Evaluation  Patient identified by MRN, date of birth, ID band Patient awake    Reviewed: Allergy & Precautions, H&P , NPO status , Patient's Chart, lab work & pertinent test results, reviewed documented beta blocker date and time   Airway Mallampati: II  TM Distance: >3 FB Neck ROM: full    Dental no notable dental hx.    Pulmonary neg pulmonary ROS,  breath sounds clear to auscultation  Pulmonary exam normal       Cardiovascular Exercise Tolerance: Good hypertension, Pt. on home beta blockers Rhythm:regular Rate:Normal  palpitations   Neuro/Psych negative neurological ROS  negative psych ROS   GI/Hepatic negative GI ROS, Neg liver ROS,   Endo/Other  negative endocrine ROS  Renal/GU negative Renal ROS  negative genitourinary   Musculoskeletal   Abdominal   Peds  Hematology negative hematology ROS (+)   Anesthesia Other Findings   Reproductive/Obstetrics negative OB ROS                             Anesthesia Physical Anesthesia Plan  ASA: II  Anesthesia Plan: Spinal   Post-op Pain Management:    Induction:   Airway Management Planned:   Additional Equipment:   Intra-op Plan:   Post-operative Plan:   Informed Consent: I have reviewed the patients History and Physical, chart, labs and discussed the procedure including the risks, benefits and alternatives for the proposed anesthesia with the patient or authorized representative who has indicated his/her understanding and acceptance.   Dental Advisory Given  Plan Discussed with: CRNA and Surgeon  Anesthesia Plan Comments:         Anesthesia Quick Evaluation

## 2014-12-20 ENCOUNTER — Encounter (HOSPITAL_COMMUNITY): Payer: Self-pay | Admitting: *Deleted

## 2014-12-20 ENCOUNTER — Inpatient Hospital Stay (HOSPITAL_COMMUNITY): Payer: Medicare Other | Admitting: Anesthesiology

## 2014-12-20 ENCOUNTER — Inpatient Hospital Stay (HOSPITAL_COMMUNITY)
Admission: RE | Admit: 2014-12-20 | Discharge: 2014-12-23 | DRG: 470 | Disposition: A | Discharge status: Skilled Nursing Facility | Payer: Medicare Other | Source: Ambulatory Visit | Attending: Orthopedic Surgery | Admitting: Orthopedic Surgery

## 2014-12-20 ENCOUNTER — Encounter (HOSPITAL_COMMUNITY)
Admission: RE | Disposition: A | Discharge status: Skilled Nursing Facility | Payer: Self-pay | Source: Ambulatory Visit | Attending: Orthopedic Surgery

## 2014-12-20 DIAGNOSIS — M25562 Pain in left knee: Secondary | ICD-10-CM | POA: Diagnosis present

## 2014-12-20 DIAGNOSIS — K219 Gastro-esophageal reflux disease without esophagitis: Secondary | ICD-10-CM | POA: Diagnosis present

## 2014-12-20 DIAGNOSIS — I10 Essential (primary) hypertension: Secondary | ICD-10-CM | POA: Diagnosis present

## 2014-12-20 DIAGNOSIS — M1712 Unilateral primary osteoarthritis, left knee: Secondary | ICD-10-CM

## 2014-12-20 DIAGNOSIS — Z96641 Presence of right artificial hip joint: Secondary | ICD-10-CM | POA: Diagnosis present

## 2014-12-20 DIAGNOSIS — M1710 Unilateral primary osteoarthritis, unspecified knee: Secondary | ICD-10-CM | POA: Diagnosis present

## 2014-12-20 DIAGNOSIS — Z01812 Encounter for preprocedural laboratory examination: Secondary | ICD-10-CM | POA: Diagnosis not present

## 2014-12-20 DIAGNOSIS — H9191 Unspecified hearing loss, right ear: Secondary | ICD-10-CM | POA: Diagnosis present

## 2014-12-20 DIAGNOSIS — M179 Osteoarthritis of knee, unspecified: Secondary | ICD-10-CM | POA: Diagnosis present

## 2014-12-20 DIAGNOSIS — Z96651 Presence of right artificial knee joint: Secondary | ICD-10-CM | POA: Diagnosis present

## 2014-12-20 DIAGNOSIS — M797 Fibromyalgia: Secondary | ICD-10-CM | POA: Diagnosis present

## 2014-12-20 HISTORY — PX: TOTAL KNEE ARTHROPLASTY: SHX125

## 2014-12-20 LAB — TYPE AND SCREEN
ABO/RH(D): B POS
Antibody Screen: NEGATIVE

## 2014-12-20 SURGERY — ARTHROPLASTY, KNEE, TOTAL
Anesthesia: Spinal | Site: Knee | Laterality: Left

## 2014-12-20 MED ORDER — ACETAMINOPHEN 650 MG RE SUPP: 650.0000 mg | Freq: Four times a day (QID) | RECTAL | Status: DC | PRN

## 2014-12-20 MED ORDER — METOCLOPRAMIDE HCL 10 MG PO TABS: 5.0000 mg | ORAL_TABLET | Freq: Three times a day (TID) | ORAL | Status: DC | PRN

## 2014-12-20 MED ORDER — PROPOFOL 10 MG/ML IV BOLUS
INTRAVENOUS | Status: AC
  Filled 2014-12-20: qty 20

## 2014-12-20 MED ORDER — BUPIVACAINE IN DEXTROSE 0.75-8.25 % IT SOLN
INTRATHECAL | Status: DC | PRN
  Administered 2014-12-20: 1.8 mL via INTRATHECAL

## 2014-12-20 MED ORDER — METHOCARBAMOL 1000 MG/10ML IJ SOLN
500.0000 mg | Freq: Four times a day (QID) | INTRAVENOUS | Status: DC | PRN
  Filled 2014-12-20: qty 5

## 2014-12-20 MED ORDER — HYDROMORPHONE HCL 1 MG/ML IJ SOLN: 0.2500 mg | INTRAMUSCULAR | Status: DC | PRN

## 2014-12-20 MED ORDER — POLYETHYLENE GLYCOL 3350 17 G PO PACK
17.0000 g | PACK | Freq: Every day | ORAL | Status: DC | PRN
  Administered 2014-12-21 – 2014-12-23 (×2): 17 g via ORAL
  Filled 2014-12-20 (×2): qty 1

## 2014-12-20 MED ORDER — EPHEDRINE SULFATE 50 MG/ML IJ SOLN
INTRAMUSCULAR | Status: DC | PRN
  Administered 2014-12-20: 10 mg via INTRAVENOUS

## 2014-12-20 MED ORDER — SODIUM CHLORIDE 0.9 % IR SOLN
Status: DC | PRN
  Administered 2014-12-20: 1000 mL

## 2014-12-20 MED ORDER — LACTATED RINGERS IV SOLN: INTRAVENOUS | Status: DC

## 2014-12-20 MED ORDER — OXYCODONE HCL 5 MG PO TABS
5.0000 mg | ORAL_TABLET | ORAL | Status: DC | PRN
  Administered 2014-12-20: 5 mg via ORAL
  Administered 2014-12-20 (×2): 10 mg via ORAL
  Administered 2014-12-21: 5 mg via ORAL
  Administered 2014-12-21 – 2014-12-22 (×6): 10 mg via ORAL
  Filled 2014-12-20 (×7): qty 2
  Filled 2014-12-20: qty 1
  Filled 2014-12-20: qty 2
  Filled 2014-12-20: qty 1

## 2014-12-20 MED ORDER — RIVAROXABAN 10 MG PO TABS
10.0000 mg | ORAL_TABLET | Freq: Every day | ORAL | Status: DC
  Administered 2014-12-21 – 2014-12-23 (×3): 10 mg via ORAL
  Filled 2014-12-20 (×4): qty 1

## 2014-12-20 MED ORDER — 0.9 % SODIUM CHLORIDE (POUR BTL) OPTIME
TOPICAL | Status: DC | PRN
  Administered 2014-12-20: 1000 mL

## 2014-12-20 MED ORDER — BUPIVACAINE LIPOSOME 1.3 % IJ SUSP
INTRAMUSCULAR | Status: DC | PRN
  Administered 2014-12-20: 20 mL

## 2014-12-20 MED ORDER — DOCUSATE SODIUM 100 MG PO CAPS
100.0000 mg | ORAL_CAPSULE | Freq: Two times a day (BID) | ORAL | Status: DC
  Administered 2014-12-20 – 2014-12-23 (×6): 100 mg via ORAL

## 2014-12-20 MED ORDER — POTASSIUM CHLORIDE IN NACL 20-0.9 MEQ/L-% IV SOLN
INTRAVENOUS | Status: DC
  Administered 2014-12-20 – 2014-12-21 (×2): via INTRAVENOUS
  Filled 2014-12-20 (×3): qty 1000

## 2014-12-20 MED ORDER — ACETAMINOPHEN 500 MG PO TABS
1000.0000 mg | ORAL_TABLET | Freq: Four times a day (QID) | ORAL | Status: AC
  Administered 2014-12-20 – 2014-12-21 (×3): 1000 mg via ORAL
  Filled 2014-12-20 (×3): qty 2

## 2014-12-20 MED ORDER — CEFAZOLIN SODIUM-DEXTROSE 2-3 GM-% IV SOLR
2.0000 g | Freq: Four times a day (QID) | INTRAVENOUS | Status: AC
  Administered 2014-12-20 (×2): 2 g via INTRAVENOUS
  Filled 2014-12-20 (×2): qty 50

## 2014-12-20 MED ORDER — METOPROLOL TARTRATE 25 MG PO TABS
25.0000 mg | ORAL_TABLET | Freq: Every morning | ORAL | Status: DC
  Administered 2014-12-21 – 2014-12-23 (×2): 25 mg via ORAL
  Filled 2014-12-20 (×3): qty 1

## 2014-12-20 MED ORDER — METHOCARBAMOL 500 MG PO TABS
500.0000 mg | ORAL_TABLET | Freq: Four times a day (QID) | ORAL | Status: DC | PRN
  Administered 2014-12-20 – 2014-12-23 (×4): 500 mg via ORAL
  Filled 2014-12-20 (×4): qty 1

## 2014-12-20 MED ORDER — TRAMADOL HCL 50 MG PO TABS: 50.0000 mg | ORAL_TABLET | Freq: Four times a day (QID) | ORAL | Status: DC | PRN

## 2014-12-20 MED ORDER — ACETAMINOPHEN 10 MG/ML IV SOLN
1000.0000 mg | Freq: Once | INTRAVENOUS | Status: AC
  Administered 2014-12-20: 1000 mg via INTRAVENOUS
  Filled 2014-12-20: qty 100

## 2014-12-20 MED ORDER — BUPIVACAINE LIPOSOME 1.3 % IJ SUSP
20.0000 mL | Freq: Once | INTRAMUSCULAR | Status: DC
  Filled 2014-12-20: qty 20

## 2014-12-20 MED ORDER — PROPOFOL INFUSION 10 MG/ML OPTIME
INTRAVENOUS | Status: DC | PRN
  Administered 2014-12-20: 75 ug/kg/min via INTRAVENOUS

## 2014-12-20 MED ORDER — BISACODYL 10 MG RE SUPP: 10.0000 mg | Freq: Every day | RECTAL | Status: DC | PRN

## 2014-12-20 MED ORDER — LATANOPROST 0.005 % OP SOLN
1.0000 [drp] | Freq: Every day | OPHTHALMIC | Status: DC
  Administered 2014-12-20 – 2014-12-23 (×3): 1 [drp] via OPHTHALMIC
  Filled 2014-12-20: qty 2.5

## 2014-12-20 MED ORDER — FENTANYL CITRATE 0.05 MG/ML IJ SOLN
INTRAMUSCULAR | Status: DC | PRN
  Administered 2014-12-20 (×2): 50 ug via INTRAVENOUS

## 2014-12-20 MED ORDER — BUPIVACAINE HCL 0.25 % IJ SOLN
INTRAMUSCULAR | Status: DC | PRN
  Administered 2014-12-20: 30 mL

## 2014-12-20 MED ORDER — CEFAZOLIN SODIUM-DEXTROSE 2-3 GM-% IV SOLR
INTRAVENOUS | Status: AC
  Filled 2014-12-20: qty 50

## 2014-12-20 MED ORDER — ONDANSETRON HCL 4 MG/2ML IJ SOLN
INTRAMUSCULAR | Status: DC | PRN
Start: 2014-12-20 — End: 2014-12-20
  Administered 2014-12-20: 4 mg via INTRAVENOUS

## 2014-12-20 MED ORDER — METOPROLOL TARTRATE 50 MG PO TABS
50.0000 mg | ORAL_TABLET | Freq: Every day | ORAL | Status: DC
  Administered 2014-12-20 – 2014-12-22 (×3): 50 mg via ORAL
  Filled 2014-12-20 (×4): qty 1

## 2014-12-20 MED ORDER — MIDAZOLAM HCL 2 MG/2ML IJ SOLN
INTRAMUSCULAR | Status: AC
  Filled 2014-12-20: qty 2

## 2014-12-20 MED ORDER — ONDANSETRON HCL 4 MG/2ML IJ SOLN: 4.0000 mg | Freq: Four times a day (QID) | INTRAMUSCULAR | Status: DC | PRN

## 2014-12-20 MED ORDER — SODIUM CHLORIDE 0.9 % IJ SOLN
INTRAMUSCULAR | Status: AC
Start: 2014-12-20 — End: 2014-12-20
  Filled 2014-12-20: qty 10

## 2014-12-20 MED ORDER — ONDANSETRON HCL 4 MG PO TABS: 4.0000 mg | ORAL_TABLET | Freq: Four times a day (QID) | ORAL | Status: DC | PRN

## 2014-12-20 MED ORDER — PHENOL 1.4 % MT LIQD
1.0000 | OROMUCOSAL | Status: DC | PRN
Start: 2014-12-20 — End: 2014-12-23
  Filled 2014-12-20: qty 177

## 2014-12-20 MED ORDER — MORPHINE SULFATE 2 MG/ML IJ SOLN
1.0000 mg | INTRAMUSCULAR | Status: DC | PRN
Start: 2014-12-20 — End: 2014-12-23
  Administered 2014-12-20 (×3): 1 mg via INTRAVENOUS
  Filled 2014-12-20 (×3): qty 1

## 2014-12-20 MED ORDER — MENTHOL 3 MG MT LOZG: 1.0000 | LOZENGE | OROMUCOSAL | Status: DC | PRN

## 2014-12-20 MED ORDER — FLEET ENEMA 7-19 GM/118ML RE ENEM: 1.0000 | ENEMA | Freq: Once | RECTAL | Status: AC | PRN

## 2014-12-20 MED ORDER — DEXAMETHASONE SODIUM PHOSPHATE 10 MG/ML IJ SOLN
10.0000 mg | Freq: Once | INTRAMUSCULAR | Status: AC
  Administered 2014-12-20: 10 mg via INTRAVENOUS

## 2014-12-20 MED ORDER — SODIUM CHLORIDE 0.9 % IJ SOLN
INTRAMUSCULAR | Status: AC
  Filled 2014-12-20: qty 50

## 2014-12-20 MED ORDER — FENTANYL CITRATE 0.05 MG/ML IJ SOLN
INTRAMUSCULAR | Status: AC
  Filled 2014-12-20: qty 2

## 2014-12-20 MED ORDER — METOCLOPRAMIDE HCL 5 MG/ML IJ SOLN: 5.0000 mg | Freq: Three times a day (TID) | INTRAMUSCULAR | Status: DC | PRN

## 2014-12-20 MED ORDER — LACTATED RINGERS IV SOLN
INTRAVENOUS | Status: DC | PRN
  Administered 2014-12-20 (×2): via INTRAVENOUS

## 2014-12-20 MED ORDER — BUPIVACAINE HCL (PF) 0.25 % IJ SOLN
INTRAMUSCULAR | Status: AC
  Filled 2014-12-20: qty 30

## 2014-12-20 MED ORDER — CEFAZOLIN SODIUM-DEXTROSE 2-3 GM-% IV SOLR
2.0000 g | INTRAVENOUS | Status: AC
  Administered 2014-12-20: 2 g via INTRAVENOUS

## 2014-12-20 MED ORDER — SODIUM CHLORIDE 0.9 % IV SOLN: INTRAVENOUS | Status: DC

## 2014-12-20 MED ORDER — SODIUM CHLORIDE 0.9 % IJ SOLN
INTRAMUSCULAR | Status: DC | PRN
  Administered 2014-12-20: 30 mL via INTRAVENOUS

## 2014-12-20 MED ORDER — CHLORHEXIDINE GLUCONATE 4 % EX LIQD: 60.0000 mL | Freq: Once | CUTANEOUS | Status: DC

## 2014-12-20 MED ORDER — DIPHENHYDRAMINE HCL 12.5 MG/5ML PO ELIX: 12.5000 mg | ORAL_SOLUTION | ORAL | Status: DC | PRN

## 2014-12-20 MED ORDER — LIDOCAINE HCL (CARDIAC) 20 MG/ML IV SOLN
INTRAVENOUS | Status: AC
  Filled 2014-12-20: qty 5

## 2014-12-20 MED ORDER — DEXAMETHASONE SODIUM PHOSPHATE 10 MG/ML IJ SOLN
INTRAMUSCULAR | Status: AC
  Filled 2014-12-20: qty 1

## 2014-12-20 MED ORDER — DEXAMETHASONE SODIUM PHOSPHATE 10 MG/ML IJ SOLN
10.0000 mg | Freq: Once | INTRAMUSCULAR | Status: AC
  Administered 2014-12-21: 10 mg via INTRAVENOUS
  Filled 2014-12-20: qty 1

## 2014-12-20 MED ORDER — ACETAMINOPHEN 325 MG PO TABS
650.0000 mg | ORAL_TABLET | Freq: Four times a day (QID) | ORAL | Status: DC | PRN
  Administered 2014-12-21 – 2014-12-23 (×4): 650 mg via ORAL
  Filled 2014-12-20 (×3): qty 2

## 2014-12-20 MED ORDER — ONDANSETRON HCL 4 MG/2ML IJ SOLN
INTRAMUSCULAR | Status: AC
  Filled 2014-12-20: qty 2

## 2014-12-20 MED ORDER — EPHEDRINE SULFATE 50 MG/ML IJ SOLN
INTRAMUSCULAR | Status: AC
  Filled 2014-12-20: qty 1

## 2014-12-20 MED ORDER — MIDAZOLAM HCL 5 MG/5ML IJ SOLN
INTRAMUSCULAR | Status: DC | PRN
  Administered 2014-12-20 (×2): 1 mg via INTRAVENOUS

## 2014-12-20 MED ORDER — PROPOFOL 10 MG/ML IV BOLUS
INTRAVENOUS | Status: DC | PRN
  Administered 2014-12-20: 20 mg via INTRAVENOUS

## 2014-12-20 SURGICAL SUPPLY — 64 items
BAG DECANTER FOR FLEXI CONT (MISCELLANEOUS) ×3 IMPLANT
BAG SPEC THK2 15X12 ZIP CLS (MISCELLANEOUS) ×1
BAG ZIPLOCK 12X15 (MISCELLANEOUS) ×3 IMPLANT
BANDAGE ELASTIC 6 VELCRO ST LF (GAUZE/BANDAGES/DRESSINGS) ×3 IMPLANT
BANDAGE ESMARK 6X9 LF (GAUZE/BANDAGES/DRESSINGS) ×1 IMPLANT
BLADE SAG 18X100X1.27 (BLADE) ×3 IMPLANT
BLADE SAW SGTL 11.0X1.19X90.0M (BLADE) ×3 IMPLANT
BNDG CMPR 9X6 STRL LF SNTH (GAUZE/BANDAGES/DRESSINGS) ×1
BNDG ESMARK 6X9 LF (GAUZE/BANDAGES/DRESSINGS) ×3
BOWL SMART MIX CTS (DISPOSABLE) ×3 IMPLANT
CAP KNEE TOTAL 3 SIGMA ×2 IMPLANT
CEMENT HV SMART SET (Cement) ×6 IMPLANT
CLOSURE WOUND 1/2 X4 (GAUZE/BANDAGES/DRESSINGS) ×1
CUFF TOURN SGL QUICK 34 (TOURNIQUET CUFF) ×3
CUFF TRNQT CYL 34X4X40X1 (TOURNIQUET CUFF) ×1 IMPLANT
DECANTER SPIKE VIAL GLASS SM (MISCELLANEOUS) ×3 IMPLANT
DRAPE EXTREMITY T 121X128X90 (DRAPE) ×3 IMPLANT
DRAPE POUCH INSTRU U-SHP 10X18 (DRAPES) ×3 IMPLANT
DRAPE U-SHAPE 47X51 STRL (DRAPES) ×3 IMPLANT
DRSG ADAPTIC 3X8 NADH LF (GAUZE/BANDAGES/DRESSINGS) ×3 IMPLANT
DURAPREP 26ML APPLICATOR (WOUND CARE) ×3 IMPLANT
ELECT REM PT RETURN 9FT ADLT (ELECTROSURGICAL) ×3
ELECTRODE REM PT RTRN 9FT ADLT (ELECTROSURGICAL) ×1 IMPLANT
EVACUATOR 1/8 PVC DRAIN (DRAIN) ×3 IMPLANT
FACESHIELD WRAPAROUND (MASK) ×15 IMPLANT
FACESHIELD WRAPAROUND OR TEAM (MASK) ×5 IMPLANT
GAUZE SPONGE 4X4 12PLY STRL (GAUZE/BANDAGES/DRESSINGS) ×3 IMPLANT
GLOVE BIO SURGEON STRL SZ7.5 (GLOVE) IMPLANT
GLOVE BIO SURGEON STRL SZ8 (GLOVE) ×3 IMPLANT
GLOVE BIOGEL PI IND STRL 6.5 (GLOVE) IMPLANT
GLOVE BIOGEL PI IND STRL 8 (GLOVE) ×1 IMPLANT
GLOVE BIOGEL PI INDICATOR 6.5 (GLOVE)
GLOVE BIOGEL PI INDICATOR 8 (GLOVE) ×2
GLOVE SURG SS PI 6.5 STRL IVOR (GLOVE) IMPLANT
GOWN STRL REUS W/TWL LRG LVL3 (GOWN DISPOSABLE) ×3 IMPLANT
GOWN STRL REUS W/TWL XL LVL3 (GOWN DISPOSABLE) IMPLANT
HANDPIECE INTERPULSE COAX TIP (DISPOSABLE) ×3
IMMOBILIZER KNEE 20 (SOFTGOODS) ×3
IMMOBILIZER KNEE 20 THIGH 36 (SOFTGOODS) ×1 IMPLANT
KIT BASIN OR (CUSTOM PROCEDURE TRAY) ×3 IMPLANT
MANIFOLD NEPTUNE II (INSTRUMENTS) ×3 IMPLANT
NDL SAFETY ECLIPSE 18X1.5 (NEEDLE) ×2 IMPLANT
NEEDLE HYPO 18GX1.5 SHARP (NEEDLE) ×6
NS IRRIG 1000ML POUR BTL (IV SOLUTION) ×3 IMPLANT
PACK TOTAL JOINT (CUSTOM PROCEDURE TRAY) ×3 IMPLANT
PAD ABD 8X10 STRL (GAUZE/BANDAGES/DRESSINGS) ×2 IMPLANT
PADDING CAST COTTON 6X4 STRL (CAST SUPPLIES) ×7 IMPLANT
PEN SKIN MARKING BROAD (MISCELLANEOUS) ×3 IMPLANT
POSITIONER SURGICAL ARM (MISCELLANEOUS) ×3 IMPLANT
SET HNDPC FAN SPRY TIP SCT (DISPOSABLE) ×1 IMPLANT
STRIP CLOSURE SKIN 1/2X4 (GAUZE/BANDAGES/DRESSINGS) ×3 IMPLANT
SUCTION FRAZIER 12FR DISP (SUCTIONS) ×3 IMPLANT
SUT MNCRL AB 4-0 PS2 18 (SUTURE) ×3 IMPLANT
SUT VIC AB 2-0 CT1 27 (SUTURE) ×9
SUT VIC AB 2-0 CT1 TAPERPNT 27 (SUTURE) ×3 IMPLANT
SUT VLOC 180 0 24IN GS25 (SUTURE) ×3 IMPLANT
SYR 20CC LL (SYRINGE) ×3 IMPLANT
SYR 50ML LL SCALE MARK (SYRINGE) ×3 IMPLANT
TOWEL OR 17X26 10 PK STRL BLUE (TOWEL DISPOSABLE) ×3 IMPLANT
TOWEL OR NON WOVEN STRL DISP B (DISPOSABLE) IMPLANT
TRAY FOLEY CATH 14FRSI W/METER (CATHETERS) ×3 IMPLANT
WATER STERILE IRR 1500ML POUR (IV SOLUTION) ×3 IMPLANT
WRAP KNEE MAXI GEL POST OP (GAUZE/BANDAGES/DRESSINGS) ×3 IMPLANT
YANKAUER SUCT BULB TIP 10FT TU (MISCELLANEOUS) ×3 IMPLANT

## 2014-12-20 NOTE — Progress Notes (Signed)
Clinical Social Work Department CLINICAL SOCIAL WORK PLACEMENT NOTE 12/20/2014  Patient:  Melissa Harris,[name]  Account Number:  [id number] Admit date:  12/20/2014  Clinical Social Worker:  Cori RazorJAMIE Afifa Truax, LCSW  Date/time:  12/20/2014 01:41 PM  Clinical Social Work is seeking post-discharge placement for this patient at the following level of care:   SKILLED NURSING   (*CSW will update this form in Epic as items are completed)     Patient/family provided with [facility] Department of Clinical Social Work's list of facilities offering this level of care within the geographic area requested by the patient (or if unable, by the patient's family).  12/20/2014  Patient/family informed of their freedom to choose among providers that offer the needed level of care, that participate in Medicare, Medicaid or managed care program needed by the patient, have an available bed and are willing to accept the patient.    Patient/family informed of [facility]' ownership interest in [facility], as well as of the fact that they are under no obligation to receive care at this facility.  PASARR submitted to EDS on 12/20/2014 PASARR number received on 12/20/2014  FL2 transmitted to all facilities in geographic area requested by pt/family on  12/20/2014 FL2 transmitted to all facilities within larger geographic area on   Patient informed that his/her managed care company has contracts with or will negotiate with  certain facilities, including the following:     Patient/family informed of bed offers received:  12/20/2014 [facility] Physician recommends and patient chooses bed at    Patient to be transferred to  on   Patient to be transferred to facility by  Patient and family notified of transfer on  Name of family member notified:    The following physician request were entered in Epic:   Additional Comments:   Cori RazorJamie Kanyon Bunn LCSW [phone]

## 2014-12-20 NOTE — Evaluation (Signed)
Physical Therapy Evaluation Patient Details Name: Melissa Harris MRN: [id number] DOB: 1928-05-21 Today's Date: 12/20/2014   History of Present Illness  L TKR  Clinical Impression  Pt s/p L TKR presents with decreased L LE strength/ROM and post op pain limiting functional mobility.  Pt would benefit from follow up rehab at SNF level to maximize IND and safety prior to return home with ltd assist    Follow Up Recommendations SNF    Equipment Recommendations  Rolling walker with 5" wheels    Recommendations for Other Services OT consult     Precautions / Restrictions Precautions Precautions: Knee;Fall Required Braces or Orthoses: Knee Immobilizer - Left Knee Immobilizer - Left: Discontinue once straight leg raise with < 10 degree lag Restrictions Weight Bearing Restrictions: No Other Position/Activity Restrictions: WBAT      Mobility  Bed Mobility Overal bed mobility: Needs Assistance Bed Mobility: Supine to Sit;Sit to Supine     Supine to sit: Min assist;Mod assist Sit to supine: Mod assist   General bed mobility comments: cues for sequence and use of R LE to self assist  Transfers Overall transfer level: Needs assistance Equipment used: Rolling walker (2 wheeled) Transfers: Sit to/from Stand Sit to Stand: Min assist;Mod assist         General transfer comment: cues for LE management and use of UEs to self assist  Ambulation/Gait Ambulation/Gait assistance: Min assist;Mod assist Ambulation Distance (Feet): 4 Feet Assistive device: Rolling walker (2 wheeled) Gait Pattern/deviations: Step-to pattern;Decreased step length - right;Decreased step length - left;Shuffle;Trunk flexed Gait velocity: decr   General Gait Details: cues for sequence, posture and position from [employer]            Wheelchair Mobility    Modified Rankin (Stroke Patients Only)       Balance                                             Pertinent  Vitals/Pain Pain Assessment: 0-10 Pain Score: 6  Pain Location: L knee Pain Descriptors / Indicators: Aching;Sore Pain Intervention(s): Limited activity within patient's tolerance;Monitored during session;Premedicated before session;Ice applied    Home Living Family/patient expects to be discharged to:: Skilled nursing facility Living Arrangements: Alone               Additional Comments: [facility] is SNF of choice    Prior Function Level of Independence: Independent               Hand Dominance   Dominant Hand: Right    Extremity/Trunk Assessment   Upper Extremity Assessment: Overall WFL for tasks assessed           Lower Extremity Assessment: LLE deficits/detail   LLE Deficits / Details: 3-/5 quads  Cervical / Trunk Assessment: Normal  Communication   Communication: No difficulties  Cognition Arousal/Alertness: Awake/alert Behavior During Therapy: WFL for tasks assessed/performed Overall Cognitive Status: Within Functional Limits for tasks assessed       Memory: Decreased short-term memory              General Comments      Exercises Total Joint Exercises Ankle Circles/Pumps: AROM;Both;15 reps;Supine Straight Leg Raises: AAROM;5 reps;Supine;Left      Assessment/Plan    PT Assessment Patient needs continued PT services  PT Diagnosis Difficulty walking   PT Problem List Decreased strength;Decreased range of  motion;Decreased activity tolerance;Decreased mobility;Decreased knowledge of use of DME;Pain  PT Treatment Interventions DME instruction;Gait training;Therapeutic exercise;Functional mobility training;Therapeutic activities;Patient/family education   PT Goals (Current goals can be found in the Care Plan section) Acute Rehab PT Goals Patient Stated Goal: Rehab and then home PT Goal Formulation: With patient Time For Goal Achievement: 12/27/14 Potential to Achieve Goals: Good    Frequency 7X/week   Barriers to discharge         Co-evaluation               End of Session Equipment Utilized During Treatment: Gait belt;Left knee immobilizer Activity Tolerance: Patient tolerated treatment well Patient left: in bed;with call bell/phone within reach;with family/visitor present Nurse Communication: Mobility status         Time: [id number] PT Time Calculation (min) (ACUTE ONLY): 34 min   Charges:   PT Evaluation $Initial PT Evaluation Tier I: 1 Procedure PT Treatments $Gait Training: 8-22 mins   PT G Codes:        Minami Arriaga 12/20/2014, 6:02 PM

## 2014-12-20 NOTE — Interval H&P Note (Signed)
History and Physical Interval Note:  12/20/2014 7:12 AM  Melissa Harris  has presented today for surgery, with the diagnosis of OA OF LEFT KNEE  The various methods of treatment have been discussed with the patient and family. After consideration of risks, benefits and other options for treatment, the patient has consented to  Procedure(s): LEFT TOTAL KNEE ARTHROPLASTY (Left) as a surgical intervention .  The patient's history has been reviewed, patient examined, no change in status, stable for surgery.  I have reviewed the patient's chart and labs.  Questions were answered to the patient's satisfaction.     Loanne DrillingALUISIO,Victoriana Aziz V

## 2014-12-20 NOTE — H&P (View-Only) (Signed)
Melissa GulaJoan M. Harris DOB: 02-03-1928 Widowed / Language: English / Race: White Female Date of Admission:  12/20/2014 CC: Left Knee Pain History of Present Illness The patient is a 81 year old female who comes in for a preoperative History and Physical. The patient is scheduled for a left total knee arthroplasty to be performed by Dr. Gus RankinFrank V. Mohamad Bruso, [facility] on 12-20-2014. The patient is a 81 year old female who presents for follow up of their knee. The patient is being followed for their left knee pain. They are now several months out from last left knee injection. Symptoms reported include: pain, aching, catching, instability and difficulty ambulating. The patient feels that they are doing poorly and report their pain level to be moderate to severe. Current treatment includes: NSAIDs. The following medication has been used for pain control: antiinflammatory medication (Aleve). The patient has reported improvement of their symptoms with: Cortisone injections. She has continued to have pain despite conservative measures and is now ready to proceed with surgery for the left knee. They have been treated conservatively in the past for the above stated problem and despite conservative measures, they continue to have progressive pain and severe functional limitations and dysfunction. They have failed non-operative management including home exercise, medications, and injections. It is felt that they would benefit from undergoing total joint replacement. Risks and benefits of the procedure have been discussed with the patient and they elect to proceed with surgery. There are no active contraindications to surgery such as ongoing infection or rapidly progressive neurological disease.  Problem List/Past Medical (Alexzandrew Tessie FassL Perkins, III PA-C; 11/29/2014 3:21 PM) Primary osteoarthritis of left knee (M17.12) Status post total right knee replacement  (Z61.096(Z96.651) Osteoarthritis Fibromyalgia Depression Gastroesophageal Reflux Disease High blood pressure Impaired Hearing Right Ear  Allergies TIMOPTIC  Family History (Alexzandrew L Perkins, III PA-C; 11/29/2014 3:15 PM) Osteoarthritis mother Asthma Dementia Mother Deceased, Dementia. age 79 Father Deceased. age 79  Social History (Alexzandrew Tessie FassL Perkins, III PA-C; 11/29/2014 3:15 PM) Pain Contract no Marital status widowed Illicit drug use no Tobacco use never smoker Tobacco / smoke exposure no Children 5 or more Alcohol use never consumed alcohol Current work status retired Biochemist, clinicalxercise Exercises daily; does other Drug/Alcohol Rehab (Previously) no Drug/Alcohol Rehab (Currently) no Living situation live alone Post-Surgical Plans Rehab  Medication History (Alexzandrew L Perkins, III PA-C; 11/29/2014 3:09 PM) Latanoprost (0.005% Solution, Ophthalmic) Active. Aleve (220MG  Tablet, Oral) Active. Tums (Oral) Specific dose unknown - Active. Metoprolol Tartrate (25MG  Tablet, Oral) Active. Aspirin (Oral) Specific dose unknown - Active. Vitamin D3 (Oral) Specific dose unknown - Active. Vitamin B12 (Oral) Specific dose unknown - Active.  Past Surgical History (Alexzandrew L Perkins, III PA-C; 11/29/2014 3:07 PM) Gallbladder Surgery open Cataract Surgery bilateral Total Hip Replacement right Total Knee Replacement right Appendectomy Tonsillectomy  Review of Systems (Alexzandrew L. Perkins III PA-C; 11/29/2014 3:21 PM) General Not Present- Chills, Fatigue, Fever, Memory Loss, Night Sweats, Weight Gain and Weight Loss. Skin Not Present- Eczema, Hives, Itching, Lesions and Rash. HEENT Not Present- Dentures, Double Vision, Headache, Hearing Loss, Tinnitus and Visual Loss. Respiratory Not Present- Allergies, Chronic Cough, Coughing up blood, Shortness of breath at rest and Shortness of breath with exertion. Cardiovascular Not Present- Chest Pain,  Difficulty Breathing Lying Down, Murmur, Palpitations, Racing/skipping heartbeats and Swelling. Gastrointestinal Not Present- Abdominal Pain, Bloody Stool, Constipation, Diarrhea, Difficulty Swallowing, Heartburn, Jaundice, Loss of appetitie, Nausea and Vomiting. Female Genitourinary Present- Urinating at Night. Not Present- Blood in Urine, Discharge, Flank Pain,  Incontinence, Painful Urination, Urgency, Urinary frequency, Urinary Retention and Weak urinary stream. Musculoskeletal Present- Joint Pain. Not Present- Back Pain, Joint Swelling, Morning Stiffness, Muscle Pain, Muscle Weakness and Spasms. Neurological Not Present- Blackout spells, Difficulty with balance, Dizziness, Paralysis, Tremor and Weakness. Psychiatric Not Present- Insomnia.   Vitals (Alexzandrew L. Perkins III PA-C; 11/29/2014 3:19 PM) 11/29/2014 3:15 PM Weight: 193 lb Height: 63.5in Weight was reported by patient. Height was reported by patient. Body Surface Area: 1.92 m Body Mass Index: 33.65 kg/m  BP: 158/80 (Sitting, Right Arm, Standard)  Physical Exam (Alexzandrew L. Perkins III PA-C; 11/29/2014 3:22 PM) General Mental Status -Alert, cooperative and good historian. General Appearance-pleasant, Not in acute distress. Orientation-Oriented X3. Build & Nutrition-Well nourished and Well developed.  Head and Neck Head-normocephalic, atraumatic . Neck Global Assessment - supple, no bruit auscultated on the right, no bruit auscultated on the left.  Eye Vision-Wears corrective lenses(bifocals). Pupil - Bilateral-Regular and Round. Motion - Bilateral-EOMI.  Chest and Lung Exam Auscultation Breath sounds - clear at anterior chest wall and clear at posterior chest wall. Adventitious sounds - No Adventitious sounds.  Cardiovascular Auscultation Rhythm - Regular rate and rhythm. Heart Sounds - S1 WNL and S2 WNL. Murmurs & Other Heart Sounds: Murmur 1 - Location - Aortic Area. Timing - Early  systolic. Grade - II/VI. Character - Low pitched.  Abdomen Palpation/Percussion Tenderness - Abdomen is non-tender to palpation. Rigidity (guarding) - Abdomen is soft. Auscultation Auscultation of the abdomen reveals - Bowel sounds normal.  Female Genitourinary Note: Not done, not pertinent to present illness   Musculoskeletal Note: Examination reveals a well-developed female in no distress. The left knee shows no effusion. Range is about 10 to 120. There is moderate crepitus on range of motion, with some tenderness medial greater than lateral, with no instability noted.   Assessment & Plan (Alexzandrew L. Perkins III PA-C; 12/08/2014 5:20 PM) Primary osteoarthritis of left knee (M17.12) Note:Surgical Plans: Left Total Knee Repalcement  Disposition: Rehab, she lives alone.  PCP: Dr. Betty [place] - Patient has been seen preoperatively and felt to be stable for surgery. Cardiology: Dr. Virgina Jock - Patient has been seen preoperatively and felt to be stable for surgery.  IV TXA  Anesthesia Issues: None  Signed electronically by Lauraine Rinne, III PA-C

## 2014-12-20 NOTE — Transfer of Care (Signed)
Immediate Anesthesia Transfer of Care Note  Patient: Melissa Harris  Procedure(s) Performed: Procedure(s) (LRB): LEFT TOTAL KNEE ARTHROPLASTY (Left)  Patient Location: PACU  Anesthesia Type: Spinal  Level of Consciousness: sedated, patient cooperative and responds to stimulation  Airway & Oxygen Therapy: Patient Spontanous Breathing and Patient connected to face mask oxgen  Post-op Assessment: Report given to PACU RN and Post -op Vital signs reviewed and stable  Post vital signs: Reviewed and stable  Complications: No apparent anesthesia complications

## 2014-12-20 NOTE — Progress Notes (Signed)
Clinical Social Work Department BRIEF PSYCHOSOCIAL ASSESSMENT 12/20/2014  Patient:  Melissa Harris, IGLESIA     Account Number:  [id number]     Admit date:  12/20/2014  Clinical Social Worker:  Lacie Scotts  Date/Time:  12/20/2014 01:20 PM  Referred by:  Physician  Date Referred:  12/20/2014 Referred for  SNF Placement   Other Referral:   Interview type:  Patient Other interview type:    PSYCHOSOCIAL DATA Living Status:  FACILITY Admitted from facility:   Level of care:   Primary support name:  Philbert Riser Primary support relationship to patient:  CHILD, ADULT Degree of support available:   unclear    CURRENT CONCERNS Current Concerns  Post-Acute Placement   Other Concerns:    SOCIAL WORK ASSESSMENT / PLAN Pt is an 81 yr old female living at home prior to hospitalization. CSW met with pt to assist with d/c planning. This is a planned admission. Pt has made plans to have ST Rehab at [facility] following hospital d/c. CSW has contacted SNF and d/c plans have been confirmed. SNF is able to admit on Sunday if pt is stable. CSW will continue to follow to assist with d/c planning.   Assessment/plan status:  Psychosocial Support/Ongoing Assessment of Needs Other assessment/ plan:   Information/referral to community resources:   Insurance coverage  for SNF and ambulance transport reviewed.    PATIENT'S/FAMILY'S RESPONSE TO PLAN OF CARE: Pt's mood is bright. " I'm looking forward to starting rehab. I can't wait to be more mobile. " Pt is motivated to begin therapy.    Werner Lean LCSW [phone]

## 2014-12-20 NOTE — Anesthesia Postprocedure Evaluation (Signed)
  Anesthesia Post-op Note  Patient: Melissa Harris  Procedure(s) Performed: Procedure(s) (LRB): LEFT TOTAL KNEE ARTHROPLASTY (Left)  Patient Location: PACU  Anesthesia Type: Spinal  Level of Consciousness: awake and alert   Airway and Oxygen Therapy: Patient Spontanous Breathing  Post-op Pain: mild  Post-op Assessment: Post-op Vital signs reviewed, Patient's Cardiovascular Status Stable, Respiratory Function Stable, Patent Airway and No signs of Nausea or vomiting  Last Vitals:  Filed Vitals:   12/20/14 1247  BP: 133/83  Pulse: 60  Temp: 36.4 C  Resp: 15    Post-op Vital Signs: stable   Complications: No apparent anesthesia complications

## 2014-12-20 NOTE — Anesthesia Procedure Notes (Signed)
Spinal Patient location during procedure: OR Start time: 12/20/2014 7:25 AM End time: 12/20/2014 7:31 AM Staffing Anesthesiologist: Rod Mae Resident/CRNA: Lajuana Carry E Performed by: resident/CRNA  Preanesthetic Checklist Completed: patient identified, site marked, surgical consent, pre-op evaluation, timeout performed, IV checked, risks and benefits discussed and monitors and equipment checked Spinal Block Patient position: sitting Prep: Betadine Patient monitoring: heart rate, continuous pulse ox and blood pressure Location: L3-4 Injection technique: single-shot Needle Needle type: Spinocan  Needle gauge: 22 G Needle length: 9 cm Assessment Sensory level: T6 Additional Notes Expiration date of kit checked and confirmed. Clear CSF, neg heme, neg paresthesia second attempt. Patient tolerated procedure well, without complications, returned to supine.

## 2014-12-20 NOTE — Op Note (Signed)
Pre-operative diagnosis- Osteoarthritis  Left knee(s)  Post-operative diagnosis- Osteoarthritis Left knee(s)  Procedure-  Left  Total Knee Arthroplasty  Surgeon- Gus RankinFrank V. Shamon Lobo, MD  Assistant- Avel Peacerew Perkins, PA-C   Anesthesia-  Spinal  EBL-* No blood loss amount entered *   Drains Hemovac  Tourniquet time-  Total Tourniquet Time Documented: Thigh (Left) - 31 minutes Total: Thigh (Left) - 31 minutes     Complications- None  Condition-PACU - hemodynamically stable.   Brief Clinical Note  Melissa Harris is a 81 y.o. year old female with end stage OA of her left knee with progressively worsening pain and dysfunction. She has constant pain, with activity and at rest and significant functional deficits with difficulties even with ADLs. She has had extensive non-op management including analgesics, injections of cortisone and viscosupplements, and home exercise program, but remains in significant pain with significant dysfunction. Radiographs show bone on bone arthritis medial and patellofemoral. She presents now for left Total Knee Arthroplasty.    Procedure in detail---   The patient is brought into the operating room and positioned supine on the operating table. After successful administration of  Spinal,   a tourniquet is placed high on the  Left thigh(s) and the lower extremity is prepped and draped in the usual sterile fashion. Time out is performed by the operating team and then the  Left lower extremity is wrapped in Esmarch, knee flexed and the tourniquet inflated to 300 mmHg.       A midline incision is made with a ten blade through the subcutaneous tissue to the level of the extensor mechanism. A fresh blade is used to make a medial parapatellar arthrotomy. Soft tissue over the proximal medial tibia is subperiosteally elevated to the joint line with a knife and into the semimembranosus bursa with a Cobb elevator. Soft tissue over the proximal lateral tibia is elevated with  attention being paid to avoiding the patellar tendon on the tibial tubercle. The patella is everted, knee flexed 90 degrees and the ACL and PCL are removed. Findings are bone on bone medial and patellofemoral with large global osteophytes.        The drill is used to create a starting hole in the distal femur and the canal is thoroughly irrigated with sterile saline to remove the fatty contents. The 5 degree Left  valgus alignment guide is placed into the femoral canal and the distal femoral cutting block is pinned to remove 10 mm off the distal femur. Resection is made with an oscillating saw.      The tibia is subluxed forward and the menisci are removed. The extramedullary alignment guide is placed referencing proximally at the medial aspect of the tibial tubercle and distally along the second metatarsal axis and tibial crest. The block is pinned to remove 2mm off the more deficient medial  side. Resection is made with an oscillating saw. Size 2.5is the most appropriate size for the tibia and the proximal tibia is prepared with the modular drill and keel punch for that size.      The femoral sizing guide is placed and size 2.5 is most appropriate. Rotation is marked off the epicondylar axis and confirmed by creating a rectangular flexion gap at 90 degrees. The size 2.5 cutting block is pinned in this rotation and the anterior, posterior and chamfer cuts are made with the oscillating saw. The intercondylar block is then placed and that cut is made.      Trial size 2.5 tibial component,  trial size 2.5 posterior stabilized femur and a 15  mm posterior stabilized rotating platform insert trial is placed. Full extension is achieved with excellent varus/valgus and anterior/posterior balance throughout full range of motion. The patella is everted and thickness measured to be 22  mm. Free hand resection is taken to 12 mm, a 35 template is placed, lug holes are drilled, trial patella is placed, and it tracks normally.  Osteophytes are removed off the posterior femur with the trial in place. All trials are removed and the cut bone surfaces prepared with pulsatile lavage. Cement is mixed and once ready for implantation, the size 2.5 tibial implant, size  2.5 posterior stabilized femoral component, and the size 35 patella are cemented in place and the patella is held with the clamp. The trial insert is placed and the knee held in full extension. The Exparel (20 ml mixed with 30 ml saline) and .25% Bupivicaine, are injected into the extensor mechanism, posterior capsule, medial and lateral gutters and subcutaneous tissues.  All extruded cement is removed and once the cement is hard the permanent 15 mm posterior stabilized rotating platform insert is placed into the tibial tray.      The wound is copiously irrigated with saline solution and the extensor mechanism closed over a hemovac drain with #1 V-loc suture. The tourniquet is released for a total tourniquet time of 31  minutes. Flexion against gravity is 140 degrees and the patella tracks normally. Subcutaneous tissue is closed with 2.0 vicryl and subcuticular with running 4.0 Monocryl. The incision is cleaned and dried and steri-strips and a bulky sterile dressing are applied. The limb is placed into a knee immobilizer and the patient is awakened and transported to recovery in stable condition.      Please note that a surgical assistant was a medical necessity for this procedure in order to perform it in a safe and expeditious manner. Surgical assistant was necessary to retract the ligaments and vital neurovascular structures to prevent injury to them and also necessary for proper positioning of the limb to allow for anatomic placement of the prosthesis.   Gus Rankin Mattheus Rauls, MD    12/20/2014, 8:29 AM

## 2014-12-20 NOTE — Progress Notes (Signed)
Utilization review completed.  

## 2014-12-21 LAB — BASIC METABOLIC PANEL
ANION GAP: 5 (ref 5–15)
BUN: 12 mg/dL (ref 6–23)
CALCIUM: 8.4 mg/dL (ref 8.4–10.5)
CHLORIDE: 105 mmol/L (ref 96–112)
CO2: 28 mmol/L (ref 19–32)
CREATININE: 0.95 mg/dL (ref 0.50–1.10)
GFR calc Af Amer: 62 mL/min — ABNORMAL LOW (ref >90)
GFR, EST NON AFRICAN AMERICAN: 53 mL/min — AB (ref >90)
Glucose, Bld: 133 mg/dL — ABNORMAL HIGH (ref 70–99)
POTASSIUM: 4.5 mmol/L (ref 3.5–5.1)
Sodium: 138 mmol/L (ref 135–145)

## 2014-12-21 LAB — CBC
HEMATOCRIT: 33.1 % — AB (ref 36.0–46.0)
HEMOGLOBIN: 11 g/dL — AB (ref 12.0–15.0)
MCH: 31.4 pg (ref 26.0–34.0)
MCHC: 33.2 g/dL (ref 30.0–36.0)
MCV: 94.6 fL (ref 78.0–100.0)
Platelets: 214 10*3/uL (ref 150–400)
RBC: 3.5 MIL/uL — ABNORMAL LOW (ref 3.87–5.11)
RDW: 12.6 % (ref 11.5–15.5)
WBC: 15.3 10*3/uL — AB (ref 4.0–10.5)

## 2014-12-21 MED ORDER — CALCIUM CARBONATE ANTACID 500 MG PO CHEW
200.0000 mg | CHEWABLE_TABLET | Freq: Three times a day (TID) | ORAL | Status: DC | PRN
  Administered 2014-12-22: 200 mg via ORAL
  Filled 2014-12-21: qty 1

## 2014-12-21 NOTE — Progress Notes (Signed)
Physical Therapy Treatment Patient Details Name: Melissa Harris MRN: [id number] DOB: 1927-11-16 Today's Date: 12/21/2014    History of Present Illness L TKR    PT Comments    Pt demonstrating increased activity tolerance but continues to be ltd by onset of nausea with activity.  Follow Up Recommendations  SNF     Equipment Recommendations  Rolling walker with 5" wheels    Recommendations for Other Services OT consult     Precautions / Restrictions Precautions Precautions: Knee;Fall Required Braces or Orthoses: Knee Immobilizer - Left Knee Immobilizer - Left: Discontinue once straight leg raise with < 10 degree lag (pt performed IND SLR this date) Restrictions Weight Bearing Restrictions: No Other Position/Activity Restrictions: WBAT    Mobility  Bed Mobility Overal bed mobility: Needs Assistance Bed Mobility: Supine to Sit;Sit to Supine     Supine to sit: Min assist Sit to supine: Min assist   General bed mobility comments: cues for sequence and use of R LE to self assist  Transfers Overall transfer level: Needs assistance Equipment used: Rolling walker (2 wheeled) Transfers: Sit to/from Stand Sit to Stand: Min assist;Mod assist         General transfer comment: cues for LE management and use of UEs to self assist  Ambulation/Gait Ambulation/Gait assistance: Min assist;Mod assist Ambulation Distance (Feet): 28 Feet (and 5) Assistive device: Rolling walker (2 wheeled) Gait Pattern/deviations: Step-to pattern;Decreased step length - right;Decreased step length - left;Shuffle;Trunk flexed Gait velocity: decr   General Gait Details: cues for sequence, posture and position from RW; pt ltd by onset nausea   Stairs            Wheelchair Mobility    Modified Rankin (Stroke Patients Only)       Balance                                    Cognition Arousal/Alertness: Awake/alert Behavior During Therapy: WFL for tasks  assessed/performed Overall Cognitive Status: Within Functional Limits for tasks assessed       Memory: Decreased short-term memory              Exercises      General Comments        Pertinent Vitals/Pain Pain Assessment: 0-10 Pain Score: 4  Pain Location: L knee Pain Descriptors / Indicators: Aching;Sore Pain Intervention(s): Monitored during session;Limited activity within patient's tolerance;Premedicated before session    Home Living                      Prior Function            PT Goals (current goals can now be found in the care plan section) Acute Rehab PT Goals Patient Stated Goal: Rehab and then home PT Goal Formulation: With patient Time For Goal Achievement: 12/27/14 Potential to Achieve Goals: Good Progress towards PT goals: Progressing toward goals    Frequency  7X/week    PT Plan Current plan remains appropriate    Co-evaluation             End of Session Equipment Utilized During Treatment: Gait belt Activity Tolerance: Patient tolerated treatment well;Other (comment) (nausea) Patient left: in bed;with call bell/phone within reach     Time: 1415-1445 PT Time Calculation (min) (ACUTE ONLY): 30 min  Charges:  $Gait Training: 23-37 mins  G Codes:      Marvion Bastidas January 16, 2015, 3:55 PM

## 2014-12-21 NOTE — Progress Notes (Signed)
   Subjective: 1 Day Post-Op Procedure(s) (LRB): LEFT TOTAL KNEE ARTHROPLASTY (Left) Patient reports pain as mild.  Had increased pain last night for 1 hour but well controlled after that We will start therapy today.  Plan is to go Skilled nursing facility after hospital stay.  Objective: Vital signs in last 24 hours: Temp:  [97.4 F (36.3 C)-98.6 F (37 C)] 98.6 F (37 C) (03/12 0515) Pulse Rate:  [57-78] 66 (03/12 0515) Resp:  [13-16] 16 (03/12 0515) BP: (99-151)/(46-99) 125/50 mmHg (03/12 0515) SpO2:  [95 %-100 %] 98 % (03/12 0515)  Intake/Output from previous day:  Intake/Output Summary (Last 24 hours) at 12/21/14 0752 Last data filed at 12/21/14 0515  Gross per 24 hour  Intake 4406.25 ml  Output   2327 ml  Net 2079.25 ml    Intake/Output this shift:    Labs:  Recent Labs  12/21/14 0507  HGB 11.0*    Recent Labs  12/21/14 0507  WBC 15.3*  RBC 3.50*  HCT 33.1*  PLT 214   No results for input(s): NA, K, CL, CO2, BUN, CREATININE, GLUCOSE, CALCIUM in the last 72 hours. No results for input(s): LABPT, INR in the last 72 hours.  EXAM General - Patient is Alert, Appropriate and Oriented Extremity - Neurologically intact Neurovascular intact No cellulitis present Compartment soft Dressing - dressing C/D/I Motor Function - intact, moving foot and toes well on exam.  Hemovac pulled without difficulty.  Past Medical History  Diagnosis Date  . Osteoarthritis   . GERD (gastroesophageal reflux disease)   . Hypertension   . Carpal tunnel syndrome     left   . Depression   . Nocturia     Assessment/Plan: 1 Day Post-Op Procedure(s) (LRB): LEFT TOTAL KNEE ARTHROPLASTY (Left) Principal Problem:   OA (osteoarthritis) of knee   Advance diet Up with therapy D/C IV fluids  DVT Prophylaxis - Xarelto Weight-Bearing as tolerated to left leg   Marayah Higdon V 12/21/2014, 7:52 AM

## 2014-12-21 NOTE — Discharge Instructions (Addendum)
° °Dr. Frank Aluisio °Total Joint Specialist °[facility] °[address] °[place], [place] [address] °[phone] ° °TOTAL KNEE REPLACEMENT POSTOPERATIVE DIRECTIONS ° ° ° °Knee Rehabilitation, Guidelines Following Surgery  °Results after knee surgery are often greatly improved when you follow the exercise, range of motion and muscle strengthening exercises prescribed by your doctor. Safety measures are also important to protect the knee from further injury. Any time any of these exercises cause you to have increased pain or swelling in your knee joint, decrease the amount until you are comfortable again and slowly increase them. If you have problems or questions, call your caregiver or physical therapist for advice.  ° °HOME CARE INSTRUCTIONS  °Remove items at home which could result in a fall. This includes throw rugs or furniture in walking pathways.  °Continue medications as instructed at time of discharge. °You may have some home medications which will be placed on hold until you complete the course of blood thinner medication.  °You may start showering once you are discharged home but do not submerge the incision under water. Just pat the incision dry and apply a dry gauze dressing on daily. °Walk with walker as instructed.  °You may resume a sexual relationship in one month or when given the OK by  your doctor.  °· Use walker as long as suggested by your caregivers. °· Avoid periods of inactivity such as sitting longer than an hour when not asleep. This helps prevent blood clots.  °You may put full weight on your legs and walk as much as is comfortable.  °You may return to work once you are cleared by your doctor.  °Do not drive a car for 6 weeks or until released by you surgeon.  °· Do not drive while taking narcotics.  °Wear the elastic stockings for three weeks following surgery during the day but you may remove then at night. °Make sure you keep all of your appointments after your  operation with all of your doctors and caregivers. You should call the office at the above phone number and make an appointment for approximately two weeks after the date of your surgery. °Change the dressing daily and reapply a dry dressing each time. °Please pick up a stool softener and laxative for home use as long as you are requiring pain medications. °· ICE to the affected knee every three hours for 30 minutes at a time and then as needed for pain and swelling.  Continue to use ice on the knee for pain and swelling from surgery. You may notice swelling that will progress down to the foot and ankle.  This is normal after surgery.  Elevate the leg when you are not up walking on it.   °It is important for you to complete the blood thinner medication as prescribed by your doctor. °· Continue to use the breathing machine which will help keep your temperature down.  It is common for your temperature to cycle up and down following surgery, especially at night when you are not up moving around and exerting yourself.  The breathing machine keeps your lungs expanded and your temperature down. ° °RANGE OF MOTION AND STRENGTHENING EXERCISES  °Rehabilitation of the knee is important following a knee injury or an operation. After just a few days of immobilization, the muscles of the thigh which control the knee become weakened and shrink (atrophy). Knee exercises are designed to build up the tone and strength of the thigh muscles and to improve knee   motion. Often times heat used for twenty to thirty minutes before working out will loosen up your tissues and help with improving the range of motion but do not use heat for the first two weeks following surgery. These exercises can be done on a training (exercise) mat, on the floor, on a table or on a bed. Use what ever works the best and is most comfortable for you Knee exercises include:  Leg Lifts - While your knee is still immobilized in a splint or cast, you can do  straight leg raises. Lift the leg to 60 degrees, hold for 3 sec, and slowly lower the leg. Repeat 10-20 times 2-3 times daily. Perform this exercise against resistance later as your knee gets better.  Quad and Hamstring Sets - Tighten up the muscle on the front of the thigh (Quad) and hold for 5-10 sec. Repeat this 10-20 times hourly. Hamstring sets are done by pushing the foot backward against an object and holding for 5-10 sec. Repeat as with quad sets.  A rehabilitation program following serious knee injuries can speed recovery and prevent re-injury in the future due to weakened muscles. Contact your doctor or a physical therapist for more information on knee rehabilitation.   SKILLED REHAB INSTRUCTIONS: If the patient is transferred to a skilled rehab facility following release from the hospital, a list of the current medications will be sent to the facility for the patient to continue.  When discharged from the skilled rehab facility, please have the facility set up the patient's [place] prior to being released. Also, the skilled facility will be responsible for providing the patient with their medications at time of release from the facility to include their pain medication, the muscle relaxants, and their blood thinner medication. If the patient is still at the rehab facility at time of the two week follow up appointment, the skilled rehab facility will also need to assist the patient in arranging follow up appointment in our office and any transportation needs.  MAKE SURE YOU:  Understand these instructions.  Will watch your condition.  Will get help right away if you are not doing well or get worse.    Pick up stool softner and laxative for home use following surgery while on pain medications. Do not submerge incision under water. Please use good hand washing techniques while changing dressing each day. May shower starting three days after surgery. Please use a clean  towel to pat the incision dry following showers. Continue to use ice for pain and swelling after surgery. Do not use any lotions or creams on the incision until instructed by your surgeon.  Take Xarelto for two and a half more weeks, then discontinue Xarelto. Once the patient has completed the Xarelto, they may resume the 325 mg Aspirin.  Postoperative Constipation Protocol  Constipation - defined medically as fewer than three stools per week and severe constipation as less than one stool per week.  One of the most common issues patients have following surgery is constipation.  Even if you have a regular bowel pattern at home, your normal regimen is likely to be disrupted due to multiple reasons following surgery.  Combination of anesthesia, postoperative narcotics, change in appetite and fluid intake all can affect your bowels.  In order to avoid complications following surgery, here are some recommendations in order to help you during your recovery period.  Colace (docusate) - Pick up an over-the-counter form of Colace or another stool softener and take  twice a day as long as you are requiring postoperative pain medications.  Take with a full glass of water daily.  If you experience loose stools or diarrhea, hold the colace until you stool forms back up.  If your symptoms do not get better within 1 week or if they get worse, check with your doctor.  Dulcolax (bisacodyl) - Pick up over-the-counter and take as directed by the product packaging as needed to assist with the movement of your bowels.  Take with a full glass of water.  Use this product as needed if not relieved by Colace only.   MiraLax (polyethylene glycol) - Pick up over-the-counter to have on hand.  MiraLax is a solution that will increase the amount of water in your bowels to assist with bowel movements.  Take as directed and can mix with a glass of water, juice, soda, coffee, or tea.  Take if you go more than two days without a  movement. Do not use MiraLax more than once per day. Call your doctor if you are still constipated or irregular after using this medication for 7 days in a row.  If you continue to have problems with postoperative constipation, please contact the office for further assistance and recommendations.  If you experience "the worst abdominal pain ever" or develop nausea or vomiting, please contact the office immediatly for further recommendations for treatment.   When discharged from the skilled rehab facility, please have the facility set up the patient's Home Health Physical Therapy prior to being released.   Also provide the patient with their medications at time of release from the facility to include their pain medication, the muscle relaxants, and their blood thinner medication.  If the patient is still at the rehab facility at time of follow up appointment, please also assist the patient in arranging follow up appointment in our office and any transportation needs. ICE to the affected knee or hip every three hours for 30 minutes at a time and then as needed for pain and swelling.     Information on my medicine - XARELTO (Rivaroxaban)  This medication education was reviewed with me or my healthcare representative as part of my discharge preparation.  The pharmacist that spoke with me during my hospital stay was:  Otho BellowsGreen, Terri L, [facility]  Why was Xarelto prescribed for you? Xarelto was prescribed for you to reduce the risk of blood clots forming after orthopedic surgery. The medical term for these abnormal blood clots is venous thromboembolism (VTE).  What do you need to know about xarelto ? Take your Xarelto ONCE DAILY at the same time every day. You may take it either with or without food.  If you have difficulty swallowing the tablet whole, you may crush it and mix in applesauce just prior to taking your dose.  Take Xarelto exactly as prescribed by your doctor and DO NOT stop taking  Xarelto without talking to the doctor who prescribed the medication.  Stopping without other VTE prevention medication to take the place of Xarelto may increase your risk of developing a clot.  After discharge, you should have regular check-up appointments with your healthcare provider that is prescribing your Xarelto.    What do you do if you miss a dose? If you miss a dose, take it as soon as you remember on the same day then continue your regularly scheduled once daily regimen the next day. Do not take two doses of Xarelto on the same day.   Important  Safety Information A possible side effect of Xarelto is bleeding. You should call your healthcare provider right away if you experience any of the following: ? Bleeding from an injury or your nose that does not stop. ? Unusual colored urine (red or dark brown) or unusual colored stools (red or black). ? Unusual bruising for unknown reasons. ? A serious fall or if you hit your head (even if there is no bleeding).  Some medicines may interact with Xarelto and might increase your risk of bleeding while on Xarelto. To help avoid this, consult your healthcare provider or pharmacist prior to using any new prescription or non-prescription medications, including herbals, vitamins, non-steroidal anti-inflammatory drugs (NSAIDs) and supplements.  Aspirin, Naproxen  This website has more information on Xarelto: VisitDestination.com.br.

## 2014-12-21 NOTE — Plan of Care (Signed)
Problem: Consults Goal: Diagnosis- Total Joint Replacement Outcome: Completed/Met Date Met:  12/21/14 Primary Total Knee LEFT  Problem: Phase III Progression Outcomes Goal: Anticoagulant follow-up in place Outcome: Not Applicable Date Met:  23/36/12 Xarelto VTE, no f/u needed.

## 2014-12-21 NOTE — Progress Notes (Signed)
Physical Therapy Treatment Patient Details Name: Melissa Harris MRN: [id number] DOB: Jun 11, 1928 Today's Date: 12/21/2014    History of Present Illness L TKR    PT Comments    Pt motivated and moving well - ltd this am by onset nausea - RN aware  Follow Up Recommendations  SNF     Equipment Recommendations  Rolling walker with 5" wheels    Recommendations for Other Services OT consult     Precautions / Restrictions Precautions Precautions: Knee;Fall Required Braces or Orthoses: Knee Immobilizer - Left Knee Immobilizer - Left: Discontinue once straight leg raise with < 10 degree lag (Pt performed IND SLR this date) Restrictions Weight Bearing Restrictions: No Other Position/Activity Restrictions: WBAT    Mobility  Bed Mobility Overal bed mobility: Needs Assistance Bed Mobility: Supine to Sit     Supine to sit: Min assist;Mod assist     General bed mobility comments: cues for sequence and use of R LE to self assist  Transfers Overall transfer level: Needs assistance Equipment used: Rolling walker (2 wheeled) Transfers: Sit to/from Stand Sit to Stand: Min assist;Mod assist         General transfer comment: cues for LE management and use of UEs to self assist  Ambulation/Gait Ambulation/Gait assistance: Min assist;Mod assist Ambulation Distance (Feet): 12 Feet Assistive device: Rolling walker (2 wheeled) Gait Pattern/deviations: Step-to pattern;Decreased step length - right;Decreased step length - left;Shuffle;Trunk flexed Gait velocity: decr   General Gait Details: cues for sequence, posture and position from RW; pt ltd by onset nausea   Stairs            Wheelchair Mobility    Modified Rankin (Stroke Patients Only)       Balance                                    Cognition Arousal/Alertness: Awake/alert Behavior During Therapy: WFL for tasks assessed/performed Overall Cognitive Status: Within Functional Limits for tasks  assessed       Memory: Decreased short-term memory              Exercises Total Joint Exercises Ankle Circles/Pumps: AROM;Both;15 reps;Supine Quad Sets: AROM;Both;10 reps;Supine Heel Slides: AAROM;Left;10 reps;Supine Straight Leg Raises: AAROM;Left;10 reps;Supine Goniometric ROM: AAROM at L knee - 10 - 45    General Comments        Pertinent Vitals/Pain Pain Assessment: 0-10 Pain Score: 4  Pain Location: L knee Pain Descriptors / Indicators: Aching;Sore Pain Intervention(s): Limited activity within patient's tolerance;Monitored during session;Premedicated before session;Ice applied    Home Living                      Prior Function            PT Goals (current goals can now be found in the care plan section) Acute Rehab PT Goals Patient Stated Goal: Rehab and then home PT Goal Formulation: With patient Time For Goal Achievement: 12/27/14 Potential to Achieve Goals: Good Progress towards PT goals: Progressing toward goals    Frequency  7X/week    PT Plan Current plan remains appropriate    Co-evaluation             End of Session Equipment Utilized During Treatment: Gait belt Activity Tolerance: Patient tolerated treatment well Patient left: with call bell/phone within reach;in chair     Time: [id number] PT Time Calculation (min) (ACUTE ONLY): 29 min  Charges:  $Gait Training: 8-22 mins $Therapeutic Exercise: 8-22 mins                    G Codes:      Melissa Harris December 26, 2014, 12:14 PM

## 2014-12-21 NOTE — Progress Notes (Signed)
OT Cancellation Note  Patient Details Name: Luisa HartJoan M Vora MRN: [id number] DOB: Jun 07, 1928   Cancelled Treatment:    Reason Eval/Treat Not Completed: Other (comment) Note plan for SNF. Will defer OT eval to SNF.  Lennox LaityStone, Shunsuke Granzow [place]  [phone] 12/21/2014, 10:29 AM

## 2014-12-22 LAB — BASIC METABOLIC PANEL
Anion gap: 6 (ref 5–15)
BUN: 13 mg/dL (ref 6–23)
CALCIUM: 8.6 mg/dL (ref 8.4–10.5)
CO2: 29 mmol/L (ref 19–32)
Chloride: 103 mmol/L (ref 96–112)
Creatinine, Ser: 0.88 mg/dL (ref 0.50–1.10)
GFR calc Af Amer: 68 mL/min — ABNORMAL LOW (ref >90)
GFR calc non Af Amer: 58 mL/min — ABNORMAL LOW (ref >90)
GLUCOSE: 131 mg/dL — AB (ref 70–99)
Potassium: 4.5 mmol/L (ref 3.5–5.1)
Sodium: 138 mmol/L (ref 135–145)

## 2014-12-22 LAB — CBC
HCT: 31.8 % — ABNORMAL LOW (ref 36.0–46.0)
HEMOGLOBIN: 10.4 g/dL — AB (ref 12.0–15.0)
MCH: 31 pg (ref 26.0–34.0)
MCHC: 32.7 g/dL (ref 30.0–36.0)
MCV: 94.9 fL (ref 78.0–100.0)
PLATELETS: 218 10*3/uL (ref 150–400)
RBC: 3.35 MIL/uL — ABNORMAL LOW (ref 3.87–5.11)
RDW: 12.8 % (ref 11.5–15.5)
WBC: 14.6 10*3/uL — ABNORMAL HIGH (ref 4.0–10.5)

## 2014-12-22 NOTE — Progress Notes (Signed)
Subjective: 2 Days Post-Op Procedure(s) (LRB): LEFT TOTAL KNEE ARTHROPLASTY (Left) Patient reports pain as mild to left knee. Well controlled. Tolerating PO's well, Progressing with PT. Denies SOB, CP, or calf pain.  Objective: Vital signs in last 24 hours: Temp:  [98 F (36.7 C)-98.2 F (36.8 C)] 98 F (36.7 C) (03/13 0517) Pulse Rate:  [60-62] 61 (03/13 0517) Resp:  [20-94] 20 (03/12 2108) BP: (122-145)/(44-66) 131/44 mmHg (03/13 0517) SpO2:  [94 %-99 %] 94 % (03/13 0517)  Intake/Output from previous day: 03/12 0701 - 03/13 0700 In: 480 [P.O.:480] Out: 800 [Urine:800] Intake/Output this shift:     Recent Labs  12/21/14 0507 12/22/14 0515  HGB 11.0* 10.4*    Recent Labs  12/21/14 0507 12/22/14 0515  WBC 15.3* 14.6*  RBC 3.50* 3.35*  HCT 33.1* 31.8*  PLT 214 218    Recent Labs  12/21/14 0507 12/22/14 0515  NA 138 138  K 4.5 4.5  CL 105 103  CO2 28 29  BUN 12 13  CREATININE 0.95 0.88  GLUCOSE 133* 131*  CALCIUM 8.4 8.6   No results for input(s): LABPT, INR in the last 72 hours.  Alert and oriented x3. RRR, Lungs clear, BS x4. Left Calf soft and non tender. L knee dressing C/D/I. No DVT signs. No signs of infection or compartment syndrome. LLE neurovascularly intact.   Assessment/Plan: 2 Days Post-Op Procedure(s) (LRB): LEFT TOTAL KNEE ARTHROPLASTY (Left) Up with PT Dressing changed Plan D/c to [facility] tomorrow WBAT LLE   Ferman Basilio L 12/22/2014, 10:03 AM

## 2014-12-22 NOTE — Progress Notes (Signed)
Physical Therapy Treatment Patient Details Name: Melissa Harris MRN: [id number] DOB: 1928-09-01 Today's Date: 12/22/2014    History of Present Illness L TKR    PT Comments    Pt continues pleasant, motivated and progressing steadily with mobility.  Follow Up Recommendations  SNF     Equipment Recommendations  Rolling walker with 5" wheels    Recommendations for Other Services OT consult     Precautions / Restrictions Precautions Precautions: Knee;Fall Required Braces or Orthoses: Knee Immobilizer - Left Knee Immobilizer - Left: Discontinue once straight leg raise with < 10 degree lag Restrictions Weight Bearing Restrictions: No Other Position/Activity Restrictions: WBAT    Mobility  Bed Mobility Overal bed mobility: Needs Assistance Bed Mobility: Sit to Supine       Sit to supine: Min assist   General bed mobility comments: cues for sequence and use of R LE to self assist  Transfers Overall transfer level: Needs assistance Equipment used: Rolling walker (2 wheeled) Transfers: Sit to/from Stand Sit to Stand: Min assist         General transfer comment: cues for LE management and use of UEs to self assist  Ambulation/Gait Ambulation/Gait assistance: Min assist;Min guard Ambulation Distance (Feet): 68 Feet Assistive device: Rolling walker (2 wheeled) Gait Pattern/deviations: Step-to pattern;Decreased step length - right;Decreased step length - left;Shuffle;Trunk flexed Gait velocity: decr   General Gait Details: cues for sequence, posture and position from RW; pt ltd by onset nausea   Stairs            Wheelchair Mobility    Modified Rankin (Stroke Patients Only)       Balance                                    Cognition Arousal/Alertness: Awake/alert Behavior During Therapy: WFL for tasks assessed/performed Overall Cognitive Status: Within Functional Limits for tasks assessed       Memory: Decreased short-term  memory              Exercises Total Joint Exercises Ankle Circles/Pumps: AROM;Both;15 reps;Supine Quad Sets: AROM;Both;Supine;20 reps Heel Slides: AAROM;Left;Supine;20 reps Straight Leg Raises: AAROM;Left;Supine;20 reps    General Comments        Pertinent Vitals/Pain Pain Assessment: 0-10 Pain Score: 4  Pain Location: L knee Pain Descriptors / Indicators: Aching;Sore Pain Intervention(s): Limited activity within patient's tolerance;Monitored during session;Ice applied    Home Living                      Prior Function            PT Goals (current goals can now be found in the care plan section) Acute Rehab PT Goals Patient Stated Goal: Rehab and then home PT Goal Formulation: With patient Time For Goal Achievement: 12/27/14 Potential to Achieve Goals: Good Progress towards PT goals: Progressing toward goals    Frequency  7X/week    PT Plan Current plan remains appropriate    Co-evaluation             End of Session Equipment Utilized During Treatment: Gait belt Activity Tolerance: Patient tolerated treatment well Patient left: in bed;with call bell/phone within reach;with family/visitor present     Time: 1015-1050 PT Time Calculation (min) (ACUTE ONLY): 35 min  Charges:  $Gait Training: 8-22 mins $Therapeutic Exercise: 8-22 mins  G Codes:      Melissa Harris 12/22/2014, 12:35 PM

## 2014-12-22 NOTE — Progress Notes (Signed)
CARE MANAGEMENT NOTE 12/22/2014  Patient:  Luisa HartRESINO,Rosemaria M   Account Number:  [id number]  Date Initiated:  12/22/2014  Documentation initiated by:  [facility]Kaylon Hitz  Subjective/Objective Assessment:   LEFT TOTAL KNEE ARTHROPLASTY     Action/Plan:   Anticipated DC Date:  12/22/2014   Anticipated DC Plan:  SKILLED NURSING FACILITY  In-house referral  Clinical Social Worker      DC Planning Services  CM consult      Choice offered to / List presented to:             Status of service:  Completed, signed off Medicare Important Message given?  YES (If response is "NO", the following Medicare IM given date fields will be blank) Date Medicare IM given:  12/22/2014 Medicare IM given by:  [facility]Axyl Sitzman Date Additional Medicare IM given:   Additional Medicare IM given by:    Discharge Disposition:  SKILLED NURSING FACILITY  Per UR Regulation:    If discussed at Long Length of Stay Meetings, dates discussed:    Comments:  12/22/2014 1100 Chart reviewed. CSW following for SNF placement. Isidoro DonningAlesia Jariana Shumard RN CCM Case Mgmt phone [phone]

## 2014-12-22 NOTE — Progress Notes (Signed)
Noted pt sitting on right side of bed, stated she didn't know how to call for help and had gotten up by herself( no walker) and gone to the bathroom and had gotten back into bed by herself.Bed alarm light on but not working, so chair alarm applied.Pts call bell secured with red bee safe clip and teach back used for pt to show she knows how to call for assist. C/o some nocturia and urgency w urination at home,[facility] at home. High fall risk measures in place.Linward HeadlandBeverly, Cote Mayabb D

## 2014-12-22 NOTE — Progress Notes (Signed)
CARE MANAGEMENT NOTE 12/22/2014  Patient:  Langsam,Aldona M   Account Number:  [id number]  Date Initiated:  12/22/2014  Documentation initiated by:  Javana Schey  Subjective/Objective Assessment:   LEFT TOTAL KNEE ARTHROPLASTY     Action/Plan:   Anticipated DC Date:  12/22/2014   Anticipated DC Plan:  SKILLED NURSING FACILITY  In-house referral  Clinical Social Worker      DC Planning Services  CM consult      Choice offered to / List presented to:             Status of service:  Completed, signed off Medicare Important Message given?  YES (If response is "NO", the following Medicare IM given date fields will be blank) Date Medicare IM given:  12/22/2014 Medicare IM given by:  Kjerstin Abrigo Date Additional Medicare IM given:   Additional Medicare IM given by:    Discharge Disposition:  SKILLED NURSING FACILITY  Per UR Regulation:    If discussed at Long Length of Stay Meetings, dates discussed:    Comments:  12/22/2014 1100 Chart reviewed. CSW following for SNF placement. Tyreece Gelles RN CCM Case Mgmt phone [phone]   

## 2014-12-23 ENCOUNTER — Encounter (HOSPITAL_COMMUNITY): Payer: Self-pay | Admitting: Orthopedic Surgery

## 2014-12-23 LAB — CBC
HCT: 29.9 % — ABNORMAL LOW (ref 36.0–46.0)
HEMOGLOBIN: 10 g/dL — AB (ref 12.0–15.0)
MCH: 31.8 pg (ref 26.0–34.0)
MCHC: 33.4 g/dL (ref 30.0–36.0)
MCV: 95.2 fL (ref 78.0–100.0)
Platelets: 199 10*3/uL (ref 150–400)
RBC: 3.14 MIL/uL — AB (ref 3.87–5.11)
RDW: 13.1 % (ref 11.5–15.5)
WBC: 13.2 10*3/uL — AB (ref 4.0–10.5)

## 2014-12-23 MED ORDER — METHOCARBAMOL 500 MG PO TABS: 500.0000 mg | ORAL_TABLET | Freq: Four times a day (QID) | ORAL | Status: DC | PRN

## 2014-12-23 MED ORDER — DOCUSATE SODIUM 100 MG PO CAPS: 100.0000 mg | ORAL_CAPSULE | Freq: Two times a day (BID) | ORAL | Status: AC

## 2014-12-23 MED ORDER — RIVAROXABAN 10 MG PO TABS: 10.0000 mg | ORAL_TABLET | Freq: Every day | ORAL | Status: DC

## 2014-12-23 MED ORDER — ACETAMINOPHEN 325 MG PO TABS: 650.0000 mg | ORAL_TABLET | Freq: Four times a day (QID) | ORAL | Status: AC | PRN

## 2014-12-23 MED ORDER — BISACODYL 10 MG RE SUPP: 10.0000 mg | Freq: Every day | RECTAL | Status: DC | PRN

## 2014-12-23 MED ORDER — FLEET ENEMA 7-19 GM/118ML RE ENEM
1.0000 | ENEMA | Freq: Once | RECTAL | Status: AC
  Administered 2014-12-23: 1 via RECTAL
  Filled 2014-12-23: qty 1

## 2014-12-23 MED ORDER — OXYCODONE HCL 5 MG PO TABS: 5.0000 mg | ORAL_TABLET | ORAL | Status: DC | PRN

## 2014-12-23 MED ORDER — TRAMADOL HCL 50 MG PO TABS: 50.0000 mg | ORAL_TABLET | Freq: Four times a day (QID) | ORAL | Status: DC | PRN

## 2014-12-23 MED ORDER — METOCLOPRAMIDE HCL 5 MG PO TABS: 5.0000 mg | ORAL_TABLET | Freq: Three times a day (TID) | ORAL | Status: DC | PRN

## 2014-12-23 MED ORDER — ONDANSETRON HCL 4 MG PO TABS: 4.0000 mg | ORAL_TABLET | Freq: Four times a day (QID) | ORAL | Status: DC | PRN

## 2014-12-23 MED ORDER — POLYETHYLENE GLYCOL 3350 17 G PO PACK: 17.0000 g | PACK | Freq: Every day | ORAL | Status: DC | PRN

## 2014-12-23 NOTE — Progress Notes (Signed)
Physical Therapy Treatment Patient Details Name: Melissa Harris MRN: [id number] DOB: Nov 23, 1927 Today's Date: 12/23/2014    History of Present Illness L TKR    PT Comments    POD # 3 pt OOB in recliner.  Amb to and from bathroom only this session due to increased c/o fatigue.  Performed some TKR TE's then allowed pt to rest prior to D/C to [facility] via car.   Follow Up Recommendations  SNF [facility])     Equipment Recommendations       Recommendations for Other Services       Precautions / Restrictions Precautions Precautions: Knee;Fall Required Braces or Orthoses: Knee Immobilizer - Left Knee Immobilizer - Left: Discontinue once straight leg raise with < 10 degree lag Restrictions Weight Bearing Restrictions: No Other Position/Activity Restrictions: WBAT    Mobility  Bed Mobility               General bed mobility comments: Pt OOB in recliner  Transfers Overall transfer level: Needs assistance Equipment used: Rolling walker (2 wheeled) Transfers: Sit to/from Stand Sit to Stand: Min assist         General transfer comment: cues for LE management and use of UEs to self assist.  Increased time  Ambulation/Gait Ambulation/Gait assistance: Min guard;Min assist Ambulation Distance (Feet): 24 Feet Assistive device: Rolling walker (2 wheeled) Gait Pattern/deviations: Step-to pattern;Decreased stance time - left;Trunk flexed Gait velocity: decreased   General Gait Details: only able to tolerate amb to amb from BR this session due to increased c/o fatigue.    Stairs            Wheelchair Mobility    Modified Rankin (Stroke Patients Only)       Balance                                    Cognition Arousal/Alertness: Awake/alert Behavior During Therapy: WFL for tasks assessed/performed Overall Cognitive Status: Within Functional Limits for tasks assessed                      Exercises   Total Knee Replacement  TE's 10 reps B LE ankle pumps 10 reps knee presses Followed by ICE     General Comments        Pertinent Vitals/Pain Pain Assessment: 0-10 Pain Score: 7  Pain Location: L knee Pain Descriptors / Indicators: Aching;Sore Pain Intervention(s): Monitored during session;Repositioned;Ice applied    Home Living                      Prior Function            PT Goals (current goals can now be found in the care plan section) Progress towards PT goals: Progressing toward goals    Frequency  7X/week    PT Plan      Co-evaluation             End of Session Equipment Utilized During Treatment: Gait belt Activity Tolerance: Patient limited by fatigue Patient left: in chair;with call bell/phone within reach;with family/visitor present     Time: 1105-1130 PT Time Calculation (min) (ACUTE ONLY): 25 min  Charges:  $Gait Training: 8-22 mins $Therapeutic Activity: 8-22 mins                    G Codes:      Felecia ShellingLori Marai Teehan  PTA  [facility]  Acute  Rehab Pager      [phone]

## 2014-12-23 NOTE — Progress Notes (Signed)
Report called to [facility] d Flavius Repsher rn

## 2014-12-23 NOTE — Progress Notes (Signed)
Clinical Social Work Department CLINICAL SOCIAL WORK PLACEMENT NOTE 12/23/2014  Patient:  Melissa Harris,Melissa Harris  Account Number:  [id number] Admit date:  12/20/2014  Clinical Social Worker:  Cori RazorJAMIE Jenafer Winterton, LCSW  Date/time:  12/20/2014 01:41 PM  Clinical Social Work is seeking post-discharge placement for this patient at the following level of care:   SKILLED NURSING   (*CSW will update this form in Epic as items are completed)     Patient/family provided with [facility] Department of Clinical Social Work's list of facilities offering this level of care within the geographic area requested by the patient (or if unable, by the patient's family).  12/20/2014  Patient/family informed of their freedom to choose among providers that offer the needed level of care, that participate in Medicare, Medicaid or managed care program needed by the patient, have an available bed and are willing to accept the patient.    Patient/family informed of [facility]' ownership interest in [facility], as well as of the fact that they are under no obligation to receive care at this facility.  PASARR submitted to EDS on 12/20/2014 PASARR number received on 12/20/2014  FL2 transmitted to all facilities in geographic area requested by pt/family on  12/20/2014 FL2 transmitted to all facilities within larger geographic area on   Patient informed that his/her managed care company has contracts with or will negotiate with  certain facilities, including the following:     Patient/family informed of bed offers received:  12/20/2014 [facility] Physician recommends and patient chooses bed at    Patient to be transferred to [facility] on  12/23/2014 Patient to be transferred to facility by CAR Patient and family notified of transfer on 12/23/2014 Name of family member notified:  son  The following physician request were entered in Epic:   Additional Comments: Pt / family are  in agreement with d/c to SNF today. PT approved transport by car. NSG reviewed d/c summary, avs, scripts. Scripts included in d/c packet. NSG to provide d/c packet to pt prior to d/c.  Cori RazorJamie Malyn Aytes LCSW [phone]

## 2014-12-23 NOTE — Discharge Summary (Signed)
Physician Discharge Summary   Patient ID: Melissa Harris MRN: [id number] DOB/AGE: November 16, 1927 81 y.o.  Admit date: 12/20/2014 Discharge date: 12-23-2014  Primary Diagnosis:  Osteoarthritis Left knee(s)  Admission Diagnoses:  Past Medical History  Diagnosis Date  . Osteoarthritis   . GERD (gastroesophageal reflux disease)   . Hypertension   . Carpal tunnel syndrome     left   . Depression   . Nocturia    Discharge Diagnoses:   Principal Problem:   OA (osteoarthritis) of knee  Estimated body mass index is 30.2 kg/(m^2) as calculated from the following:   Height as of this encounter: _0  (1.626 m).   Weight as of this encounter: 79.833 kg (176 lb).  Procedure:  Procedure(s) (LRB): LEFT TOTAL KNEE ARTHROPLASTY (Left)   Consults: None  HPI: Melissa Harris is a 81 y.o. year old female with end stage OA of her left knee with progressively worsening pain and dysfunction. She has constant pain, with activity and at rest and significant functional deficits with difficulties even with ADLs. She has had extensive non-op management including analgesics, injections of cortisone and viscosupplements, and home exercise program, but remains in significant pain with significant dysfunction. Radiographs show bone on bone arthritis medial and patellofemoral. She presents now for left Total Knee Arthroplasty.   Laboratory Data: Admission on 12/20/2014  Component Date Value Ref Range Status  . ABO/RH(D) 12/20/2014 B POS   Final  . Antibody Screen 12/20/2014 NEG   Final  . Sample Expiration 12/20/2014 12/23/2014   Final  . WBC 12/21/2014 15.3* 4.0 - 10.5 K/uL Final  . RBC 12/21/2014 3.50* 3.87 - 5.11 MIL/uL Final  . Hemoglobin 12/21/2014 11.0* 12.0 - 15.0 g/dL Final  . HCT 12/21/2014 33.1* 36.0 - 46.0 % Final  . MCV 12/21/2014 94.6  78.0 - 100.0 fL Final  . MCH 12/21/2014 31.4  26.0 - 34.0 pg Final  . MCHC 12/21/2014 33.2  30.0 - 36.0 g/dL Final  . RDW 12/21/2014 12.6  11.5 - 15.5 % Final   . Platelets 12/21/2014 214  150 - 400 K/uL Final  . Sodium 12/21/2014 138  135 - 145 mmol/L Final  . Potassium 12/21/2014 4.5  3.5 - 5.1 mmol/L Final  . Chloride 12/21/2014 105  96 - 112 mmol/L Final  . CO2 12/21/2014 28  19 - 32 mmol/L Final  . Glucose, Bld 12/21/2014 133* 70 - 99 mg/dL Final  . BUN 12/21/2014 12  6 - 23 mg/dL Final  . Creatinine, Ser 12/21/2014 0.95  0.50 - 1.10 mg/dL Final  . Calcium 12/21/2014 8.4  8.4 - 10.5 mg/dL Final  . GFR calc non Af Amer 12/21/2014 53* >90 mL/min Final  . GFR calc Af Amer 12/21/2014 62* >90 mL/min Final   Comment: (NOTE) The eGFR has been calculated using the CKD EPI equation. This calculation has not been validated in all clinical situations. eGFR's persistently <90 mL/min signify possible Chronic Kidney Disease.   . Anion gap 12/21/2014 5  5 - 15 Final  . WBC 12/22/2014 14.6* 4.0 - 10.5 K/uL Final  . RBC 12/22/2014 3.35* 3.87 - 5.11 MIL/uL Final  . Hemoglobin 12/22/2014 10.4* 12.0 - 15.0 g/dL Final  . HCT 12/22/2014 31.8* 36.0 - 46.0 % Final  . MCV 12/22/2014 94.9  78.0 - 100.0 fL Final  . MCH 12/22/2014 31.0  26.0 - 34.0 pg Final  . MCHC 12/22/2014 32.7  30.0 - 36.0 g/dL Final  . RDW 12/22/2014 12.8  11.5 - 15.5 %  Final  . Platelets 12/22/2014 218  150 - 400 K/uL Final  . Sodium 12/22/2014 138  135 - 145 mmol/L Final  . Potassium 12/22/2014 4.5  3.5 - 5.1 mmol/L Final  . Chloride 12/22/2014 103  96 - 112 mmol/L Final  . CO2 12/22/2014 29  19 - 32 mmol/L Final  . Glucose, Bld 12/22/2014 131* 70 - 99 mg/dL Final  . BUN 12/22/2014 13  6 - 23 mg/dL Final  . Creatinine, Ser 12/22/2014 0.88  0.50 - 1.10 mg/dL Final  . Calcium 12/22/2014 8.6  8.4 - 10.5 mg/dL Final  . GFR calc non Af Amer 12/22/2014 58* >90 mL/min Final  . GFR calc Af Amer 12/22/2014 68* >90 mL/min Final   Comment: (NOTE) The eGFR has been calculated using the CKD EPI equation. This calculation has not been validated in all clinical situations. eGFR's persistently <90  mL/min signify possible Chronic Kidney Disease.   . Anion gap 12/22/2014 6  5 - 15 Final  . WBC 12/23/2014 13.2* 4.0 - 10.5 K/uL Final  . RBC 12/23/2014 3.14* 3.87 - 5.11 MIL/uL Final  . Hemoglobin 12/23/2014 10.0* 12.0 - 15.0 g/dL Final  . HCT 12/23/2014 29.9* 36.0 - 46.0 % Final  . MCV 12/23/2014 95.2  78.0 - 100.0 fL Final  . MCH 12/23/2014 31.8  26.0 - 34.0 pg Final  . MCHC 12/23/2014 33.4  30.0 - 36.0 g/dL Final  . RDW 12/23/2014 13.1  11.5 - 15.5 % Final  . Platelets 12/23/2014 199  150 - 400 K/uL Final  Hospital Outpatient Visit on 12/10/2014  Component Date Value Ref Range Status  . aPTT 12/10/2014 27  24 - 37 seconds Final  . WBC 12/10/2014 8.4  4.0 - 10.5 K/uL Final  . RBC 12/10/2014 4.58  3.87 - 5.11 MIL/uL Final  . Hemoglobin 12/10/2014 14.4  12.0 - 15.0 g/dL Final  . HCT 12/10/2014 43.7  36.0 - 46.0 % Final  . MCV 12/10/2014 95.4  78.0 - 100.0 fL Final  . MCH 12/10/2014 31.4  26.0 - 34.0 pg Final  . MCHC 12/10/2014 33.0  30.0 - 36.0 g/dL Final  . RDW 12/10/2014 12.8  11.5 - 15.5 % Final  . Platelets 12/10/2014 261  150 - 400 K/uL Final  . Sodium 12/10/2014 139  135 - 145 mmol/L Final  . Potassium 12/10/2014 3.9  3.5 - 5.1 mmol/L Final  . Chloride 12/10/2014 102  96 - 112 mmol/L Final  . CO2 12/10/2014 29  19 - 32 mmol/L Final  . Glucose, Bld 12/10/2014 103* 70 - 99 mg/dL Final  . BUN 12/10/2014 16  6 - 23 mg/dL Final  . Creatinine, Ser 12/10/2014 1.08  0.50 - 1.10 mg/dL Final  . Calcium 12/10/2014 9.7  8.4 - 10.5 mg/dL Final  . Total Protein 12/10/2014 7.8  6.0 - 8.3 g/dL Final  . Albumin 12/10/2014 4.2  3.5 - 5.2 g/dL Final  . AST 12/10/2014 19  0 - 37 U/L Final  . ALT 12/10/2014 16  0 - 35 U/L Final  . Alkaline Phosphatase 12/10/2014 77  39 - 117 U/L Final  . Total Bilirubin 12/10/2014 0.9  0.3 - 1.2 mg/dL Final  . GFR calc non Af Amer 12/10/2014 45* >90 mL/min Final  . GFR calc Af Amer 12/10/2014 53* >90 mL/min Final   Comment: (NOTE) The eGFR has been  calculated using the CKD EPI equation. This calculation has not been validated in all clinical situations. eGFR's persistently <90 mL/min signify possible  Chronic Kidney Disease.   . Anion gap 12/10/2014 8  5 - 15 Final  . Prothrombin Time 12/10/2014 13.6  11.6 - 15.2 seconds Final  . INR 12/10/2014 1.03  0.00 - 1.49 Final  . Color, Urine 12/10/2014 YELLOW  YELLOW Final  . APPearance 12/10/2014 CLOUDY* CLEAR Final  . Specific Gravity, Urine 12/10/2014 1.022  1.005 - 1.030 Final  . pH 12/10/2014 6.5  5.0 - 8.0 Final  . Glucose, UA 12/10/2014 NEGATIVE  NEGATIVE mg/dL Final  . Hgb urine dipstick 12/10/2014 SMALL* NEGATIVE Final  . Bilirubin Urine 12/10/2014 NEGATIVE  NEGATIVE Final  . Ketones, ur 12/10/2014 NEGATIVE  NEGATIVE mg/dL Final  . Protein, ur 12/10/2014 NEGATIVE  NEGATIVE mg/dL Final  . Urobilinogen, UA 12/10/2014 0.2  0.0 - 1.0 mg/dL Final  . Nitrite 12/10/2014 NEGATIVE  NEGATIVE Final  . Leukocytes, UA 12/10/2014 NEGATIVE  NEGATIVE Final  . MRSA, PCR 12/10/2014 NEGATIVE  NEGATIVE Final  . Staphylococcus aureus 12/10/2014 NEGATIVE  NEGATIVE Final   Comment:        The Xpert SA Assay (FDA approved for NASAL specimens in patients over 81 years of age), is one component of a comprehensive surveillance program.  Test performance has been validated by [facility] for patients greater than or equal to 23 year old. It is not intended to diagnose infection nor to guide or monitor treatment.   . Squamous Epithelial / LPF 12/10/2014 FEW* RARE Final  . WBC, UA 12/10/2014 0-2  <3 WBC/hpf Final  . RBC / HPF 12/10/2014 3-6  <3 RBC/hpf Final  . Bacteria, UA 12/10/2014 FEW* RARE Final     X-Rays:No results found.  EKG: Orders placed or performed in visit on 12/10/14  . EKG 12-Lead     Hospital Course: Melissa Harris is a 81 y.o. who was admitted to [facility]. They were brought to the operating room on 12/20/2014 and underwent Procedure(s): LEFT TOTAL KNEE  ARTHROPLASTY.  Patient tolerated the procedure well and was later transferred to the recovery room and then to the orthopaedic floor for postoperative care.  They were given PO and IV analgesics for pain control following their surgery.  They were given 24 hours of postoperative antibiotics of  Anti-infectives    Start     Dose/Rate Route Frequency Ordered Stop   12/20/14 1330  ceFAZolin (ANCEF) IVPB 2 g/50 mL premix     2 g 100 mL/hr over 30 Minutes Intravenous Every 6 hours 12/20/14 1018 12/20/14 2017   12/20/14 0532  ceFAZolin (ANCEF) IVPB 2 g/50 mL premix     2 g 100 mL/hr over 30 Minutes Intravenous On call to O.R. 12/20/14 0532 12/20/14 4259     and started on DVT prophylaxis in the form of Xarelto.   PT and OT were ordered for total joint protocol.  Discharge planning consulted to help with postop disposition and equipment needs.  Patient had a tough night on the evening of surgery.  They started to get up OOB with therapy on day one. Hemovac drain was pulled without difficulty.  Continued to work with therapy into day two.  Dressing was changed on day two and the incision was healing well.  By day three, the patient had progressed with therapy and meeting their goals.  Incision was healing well.  Patient was seen in rounds and was ready to go the SNF.  Discharge to SNF Diet - Cardiac diet Follow up - in 1 week, Next Tuesday 3/22 with Dr. Wynelle Link Activity -  WBAT Disposition - Skilled nursing facility Condition Upon Discharge - Stable D/C Meds - See DC Summary DVT Prophylaxis - Xarelto  Discharge Instructions    Call MD / Call 911    Complete by:  As directed   If you experience chest pain or shortness of breath, CALL 911 and be transported to the hospital emergency room.  If you develope a fever above 101 F, pus (white drainage) or increased drainage or redness at the wound, or calf pain, call your surgeon's office.     Change dressing    Complete by:  As directed   Change dressing  daily with sterile 4 x 4 inch gauze dressing and apply TED hose. Do not submerge the incision under water.     Constipation Prevention    Complete by:  As directed   Drink plenty of fluids.  Prune juice may be helpful.  You may use a stool softener, such as Colace (over the counter) 100 mg twice a day.  Use MiraLax (over the counter) for constipation as needed.     Diet - low sodium heart healthy    Complete by:  As directed      Discharge instructions    Complete by:  As directed   Pick up stool softner and laxative for home use following surgery while on pain medications. Do not submerge incision under water. Please use good hand washing techniques while changing dressing each day. May shower starting three days after surgery. Please use a clean towel to pat the incision dry following showers. Continue to use ice for pain and swelling after surgery. Do not use any lotions or creams on the incision until instructed by your surgeon.  Take Xarelto for two and a half more weeks, then discontinue Xarelto. Once the patient has completed the Xarelto, they may resume the 325 mg Aspirin.  Postoperative Constipation Protocol  Constipation - defined medically as fewer than three stools per week and severe constipation as less than one stool per week.  One of the most common issues patients have following surgery is constipation.  Even if you have a regular bowel pattern at home, your normal regimen is likely to be disrupted due to multiple reasons following surgery.  Combination of anesthesia, postoperative narcotics, change in appetite and fluid intake all can affect your bowels.  In order to avoid complications following surgery, here are some recommendations in order to help you during your recovery period.  Colace (docusate) - Pick up an over-the-counter form of Colace or another stool softener and take twice a day as long as you are requiring postoperative pain medications.  Take with a full  glass of water daily.  If you experience loose stools or diarrhea, hold the colace until you stool forms back up.  If your symptoms do not get better within 1 week or if they get worse, check with your doctor.  Dulcolax (bisacodyl) - Pick up over-the-counter and take as directed by the product packaging as needed to assist with the movement of your bowels.  Take with a full glass of water.  Use this product as needed if not relieved by Colace only.   MiraLax (polyethylene glycol) - Pick up over-the-counter to have on hand.  MiraLax is a solution that will increase the amount of water in your bowels to assist with bowel movements.  Take as directed and can mix with a glass of water, juice, soda, coffee, or tea.  Take if you go more than two  days without a movement. Do not use MiraLax more than once per day. Call your doctor if you are still constipated or irregular after using this medication for 7 days in a row.  If you continue to have problems with postoperative constipation, please contact the office for further assistance and recommendations.  If you experience "the worst abdominal pain ever" or develop nausea or vomiting, please contact the office immediatly for further recommendations for treatment.  When discharged from the skilled rehab facility, please have the facility set up the patient's [place] prior to being released.   Also provide the patient with their medications at time of release from the facility to include their pain medication, the muscle relaxants, and their blood thinner medication.  If the patient is still at the rehab facility at time of follow up appointment, please also assist the patient in arranging follow up appointment in our office and any transportation needs. ICE to the affected knee or hip every three hours for 30 minutes at a time and then as needed for pain and swelling.     Do not put a pillow under the knee. Place it under the heel.     Complete by:  As directed      Do not sit on low chairs, stoools or toilet seats, as it may be difficult to get up from low surfaces    Complete by:  As directed      Driving restrictions    Complete by:  As directed   No driving until released by the physician.     Increase activity slowly as tolerated    Complete by:  As directed      Lifting restrictions    Complete by:  As directed   No lifting until released by the physician.     Patient may shower    Complete by:  As directed   You may shower without a dressing once there is no drainage.  Do not wash over the wound.  If drainage remains, do not shower until drainage stops.     TED hose    Complete by:  As directed   Use stockings (TED hose) for 3 weeks on both leg(s).  You may remove them at night for sleeping.     Weight bearing as tolerated    Complete by:  As directed   Laterality:  left  Extremity:  Lower            Medication List    STOP taking these medications        aspirin 325 MG tablet     calcium carbonate 500 MG chewable tablet  Commonly known as:  TUMS - dosed in mg elemental calcium     multivitamin with minerals Tabs tablet     naproxen sodium 220 MG tablet  Commonly known as:  ANAPROX     VITAMIN B-12 PO     Vitamin D3 1000 UNITS Caps     VITAMIN E PO      TAKE these medications        acetaminophen 325 MG tablet  Commonly known as:  TYLENOL  Take 2 tablets (650 mg total) by mouth every 6 (six) hours as needed for mild pain (or Fever >/= 101).     bisacodyl 10 MG suppository  Commonly known as:  DULCOLAX  Place 1 suppository (10 mg total) rectally daily as needed for moderate constipation.     docusate sodium 100 MG capsule  Commonly known as:  COLACE  Take 1 capsule (100 mg total) by mouth 2 (two) times daily.     latanoprost 0.005 % ophthalmic solution  Commonly known as:  XALATAN  Place 1 drop into both eyes at bedtime.     methocarbamol 500 MG tablet  Commonly known as:   ROBAXIN  Take 1 tablet (500 mg total) by mouth every 6 (six) hours as needed for muscle spasms.     metoCLOPramide 5 MG tablet  Commonly known as:  REGLAN  Take 1-2 tablets (5-10 mg total) by mouth every 8 (eight) hours as needed for nausea (if ondansetron (ZOFRAN) ineffective.).     metoprolol tartrate 25 MG tablet  Commonly known as:  LOPRESSOR  Take 25-50 mg by mouth 2 (two) times daily. 1 in the morning and two in the evening     ondansetron 4 MG tablet  Commonly known as:  ZOFRAN  Take 1 tablet (4 mg total) by mouth every 6 (six) hours as needed for nausea.     oxyCODONE 5 MG immediate release tablet  Commonly known as:  Oxy IR/ROXICODONE  Take 1-2 tablets (5-10 mg total) by mouth every 3 (three) hours as needed for moderate pain, severe pain or breakthrough pain.     polyethylene glycol packet  Commonly known as:  MIRALAX / GLYCOLAX  Take 17 g by mouth daily as needed for mild constipation.     rivaroxaban 10 MG Tabs tablet  Commonly known as:  XARELTO  - Take 1 tablet (10 mg total) by mouth daily with breakfast. Take Xarelto for two and a half more weeks, then discontinue Xarelto.  - Once the patient has completed the Xarelto, they may resume the 325 mg Aspirin.     traMADol 50 MG tablet  Commonly known as:  ULTRAM  Take 1-2 tablets (50-100 mg total) by mouth every 6 (six) hours as needed (mild pain).           Follow-up Information    Follow up with Gearlean Alf, MD. Schedule an appointment as soon as possible for a visit on 12/31/2014.   Specialty:  Orthopedic Surgery   Why:  Call office ASAP to set up appointment on Tuesday 3/22 with Dr. Wynelle Link.   Contact information:   [address] [place] [address] [phone]       Signed: Arlee Muslim, PA-C Orthopaedic Surgery 12/23/2014, 10:15 AM

## 2014-12-23 NOTE — Progress Notes (Signed)
   Subjective: 3 Days Post-Op Procedure(s) (LRB): LEFT TOTAL KNEE ARTHROPLASTY (Left) Patient reports pain as mild.   Patient seen in rounds by Dr. Lequita HaltAluisio. Patient is well, but has had some minor complaints of pain in the knee, requiring pain medications Patient is ready to go to the SNF  Objective: Vital signs in last 24 hours: Temp:  [98.2 F (36.8 C)-100.6 F (38.1 C)] 98.2 F (36.8 C) (03/14 0441) Pulse Rate:  [66-84] 74 (03/14 0441) Resp:  [18-20] 20 (03/14 0441) BP: (119-129)/(45-57) 129/46 mmHg (03/14 0441) SpO2:  [93 %-96 %] 93 % (03/14 0441)  Intake/Output from previous day:  Intake/Output Summary (Last 24 hours) at 12/23/14 1002 Last data filed at 12/22/14 2300  Gross per 24 hour  Intake    360 ml  Output      0 ml  Net    360 ml    Labs:  Recent Labs  12/21/14 0507 12/22/14 0515 12/23/14 0456  HGB 11.0* 10.4* 10.0*    Recent Labs  12/22/14 0515 12/23/14 0456  WBC 14.6* 13.2*  RBC 3.35* 3.14*  HCT 31.8* 29.9*  PLT 218 199    Recent Labs  12/21/14 0507 12/22/14 0515  NA 138 138  K 4.5 4.5  CL 105 103  CO2 28 29  BUN 12 13  CREATININE 0.95 0.88  GLUCOSE 133* 131*  CALCIUM 8.4 8.6   No results for input(s): LABPT, INR in the last 72 hours.  EXAM: General - Patient is Alert and Oriented Extremity - Neurovascular intact Sensation intact distally Incision - clean, dry, no drainage, healing Motor Function - intact, moving foot and toes well on exam.   Assessment/Plan: 3 Days Post-Op Procedure(s) (LRB): LEFT TOTAL KNEE ARTHROPLASTY (Left) Procedure(s) (LRB): LEFT TOTAL KNEE ARTHROPLASTY (Left) Past Medical History  Diagnosis Date  . Osteoarthritis   . GERD (gastroesophageal reflux disease)   . Hypertension   . Carpal tunnel syndrome     left   . Depression   . Nocturia    Principal Problem:   OA (osteoarthritis) of knee  Estimated body mass index is 30.2 kg/(m^2) as calculated from the following:   Height as of this  encounter: 5\' 4"  (1.626 m).   Weight as of this encounter: 79.833 kg (176 lb). Up with therapy Discharge to SNF Diet - Cardiac diet Follow up - in 1 week, Next Tuesday 3/22 with Dr. Lequita HaltAluisio Activity - WBAT Disposition - Skilled nursing facility Condition Upon Discharge - Stable D/C Meds - See DC Summary DVT Prophylaxis - Xarelto  Avel Peacerew Breon Rehm, PA-C Orthopaedic Surgery 12/23/2014, 10:02 AM

## 2016-11-26 ENCOUNTER — Ambulatory Visit: Payer: Self-pay | Admitting: Orthopedic Surgery

## 2016-12-02 NOTE — Patient Instructions (Signed)
Melissa Harris  12/02/2016   Your procedure is scheduled on: 12/15/2016    [facility]  [facility] to 3rd floor to  Short Stay Center at    1215pm  Call this number if you have problems the morning of surgery [phone]   Remember: ONLY 1 PERSON MAY GO WITH YOU TO SHORT STAY TO GET  READY MORNING OF YOUR SURGERY.  Do not eat food after midnite.  May have clear liquids from 12 midnite until 0830am then nothing by mouth.      Take these medicines the morning of surgery with A SIP OF WATER: Metoprolol ( Lopressor), Oxycodone if needed                                 You may not have any metal on your body including hair pins and              piercings  Do not wear jewelry, make-up, lotions, powders or perfumes, deodorant             Do not wear nail polish.  Do not shave  48 hours prior to surgery.     Do not bring valuables to the hospital. [facility] IS NOT             RESPONSIBLE   FOR VALUABLES.  Contacts, dentures or bridgework may not be worn into surgery.  Leave suitcase in the car. After surgery it may be brought to your room.                     Please read over the following fact sheets you were given: _____________________________________________________________________             [facility] - Preparing [facility] Before surgery, you can play an important role.  Because skin is not sterile, your skin needs to be as free of germs as possible.  You can reduce the number of germs on your skin by washing with CHG (chlorahexidine gluconate) soap before surgery.  CHG is an antiseptic cleaner which kills germs and bonds with the skin to continue killing germs even after washing. Please DO NOT use if you have an allergy to CHG or antibacterial soaps.  If your skin becomes reddened/irritated stop using the CHG and inform your nurse when you arrive at Short Stay. Do not shave (including legs and underarms) for at least  48 hours prior to the first CHG shower.  You may shave your face/neck. Please follow these instructions carefully:  1.  Shower with CHG Soap the night before surgery and the  morning of Surgery.  2.  If you choose to wash your hair, wash your hair first as usual with your  normal  shampoo.  3.  After you shampoo, rinse your hair and body thoroughly to remove the  shampoo.                           4.  Use CHG as you would any other liquid soap.  You can apply chg directly  to the skin and wash                       Gently with a scrungie or clean  washcloth.  5.  Apply the CHG Soap to your body ONLY FROM THE NECK DOWN.   Do not use on face/ open                           Wound or open sores. Avoid contact with eyes, ears mouth and genitals (private parts).                       Wash face,  Genitals (private parts) with your normal soap.             6.  Wash thoroughly, paying special attention to the area where your surgery  will be performed.  7.  Thoroughly rinse your body with warm water from the neck down.  8.  DO NOT shower/wash with your normal soap after using and rinsing off  the CHG Soap.                9.  Pat yourself dry with a clean towel.            10.  Wear clean pajamas.            11.  Place clean sheets on your bed the night of your first shower and do not  sleep with pets. Day of Surgery : Do not apply any lotions/deodorants the morning of surgery.  Please wear clean clothes to the hospital/surgery center.  FAILURE TO FOLLOW THESE INSTRUCTIONS MAY RESULT IN THE CANCELLATION OF YOUR SURGERY PATIENT SIGNATURE_________________________________  NURSE SIGNATURE__________________________________  ________________________________________________________________________    CLEAR LIQUID DIET   Foods Allowed                                                                     Foods Excluded  Coffee and tea, regular and decaf                             liquids that you cannot   Plain Jell-O in any flavor                                             see through such as: Fruit ices (not with fruit pulp)                                     milk, soups, orange juice  Iced Popsicles                                    All solid food Carbonated beverages, regular and diet                                    Cranberry, grape and apple juices Sports drinks like Gatorade Lightly seasoned clear broth or consume(fat free) Sugar, honey syrup  Sample Menu Breakfast  Lunch                                     Supper Cranberry juice                    Beef broth                            Chicken broth Jell-O                                     Grape juice                           Apple juice Coffee or tea                        Jell-O                                      Popsicle                                                Coffee or tea                        Coffee or tea  _____________________________________________________________________   WHAT IS A BLOOD TRANSFUSION? Blood Transfusion Information  A transfusion is the replacement of blood or some of its parts. Blood is made up of multiple cells which provide different functions.  Red blood cells carry oxygen and are used for blood loss replacement.  White blood cells fight against infection.  Platelets control bleeding.  Plasma helps clot blood.  Other blood products are available for specialized needs, such as hemophilia or other clotting disorders. BEFORE THE TRANSFUSION  Who gives blood for transfusions?   Healthy volunteers who are fully evaluated to make sure their blood is safe. This is blood bank blood. Transfusion therapy is the safest it has ever been in the practice of medicine. Before blood is taken from a donor, a complete history is taken to make sure that person has no history of diseases nor engages in risky social behavior (examples are intravenous drug use or  sexual activity with multiple partners). The donor's travel history is screened to minimize risk of transmitting infections, such as malaria. The donated blood is tested for signs of infectious diseases, such as HIV and hepatitis. The blood is then tested to be sure it is compatible with you in order to minimize the chance of a transfusion reaction. If you or a relative donates blood, this is often done in anticipation of surgery and is not appropriate for emergency situations. It takes many days to process the donated blood. RISKS AND COMPLICATIONS Although transfusion therapy is very safe and saves many lives, the main dangers of transfusion include:   Getting an infectious disease.  Developing a transfusion reaction. This is an allergic reaction to something in the blood you were given. Every precaution is taken to prevent this. The decision to have  a blood transfusion has been considered carefully by your caregiver before blood is given. Blood is not given unless the benefits outweigh the risks. AFTER THE TRANSFUSION  Right after receiving a blood transfusion, you will usually feel much better and more energetic. This is especially true if your red blood cells have gotten low (anemic). The transfusion raises the level of the red blood cells which carry oxygen, and this usually causes an energy increase.  The nurse administering the transfusion will monitor you carefully for complications. HOME CARE INSTRUCTIONS  No special instructions are needed after a transfusion. You may find your energy is better. Speak with your caregiver about any limitations on activity for underlying diseases you may have. SEEK MEDICAL CARE IF:   Your condition is not improving after your transfusion.  You develop redness or irritation at the intravenous (IV) site. SEEK IMMEDIATE MEDICAL CARE IF:  Any of the following symptoms occur over the next 12 hours:  Shaking chills.  You have a temperature by mouth above  102 F (38.9 C), not controlled by medicine.  Chest, back, or muscle pain.  People around you feel you are not acting correctly or are confused.  Shortness of breath or difficulty breathing.  Dizziness and fainting.  You get a rash or develop hives.  You have a decrease in urine output.  Your urine turns a dark color or changes to pink, red, or brown. Any of the following symptoms occur over the next 10 days:  You have a temperature by mouth above 102 F (38.9 C), not controlled by medicine.  Shortness of breath.  Weakness after normal activity.  The white part of the eye turns yellow (jaundice).  You have a decrease in the amount of urine or are urinating less often.  Your urine turns a dark color or changes to pink, red, or brown. Document Released: 09/24/2000 Document Revised: 12/20/2011 Document Reviewed: 05/13/2008 ExitCare Patient Information 2014 [place], [place].  _______________________________________________________________________  Incentive Spirometer  An incentive spirometer is a tool that can help keep your lungs clear and active. This tool measures how well you are filling your lungs with each breath. Taking long deep breaths may help reverse or decrease the chance of developing breathing (pulmonary) problems (especially infection) following:  A long period of time when you are unable to move or be active. BEFORE THE PROCEDURE   If the spirometer includes an indicator to show your best effort, your nurse or respiratory therapist will set it to a desired goal.  If possible, sit up straight or lean slightly forward. Try not to slouch.  Hold the incentive spirometer in an upright position. INSTRUCTIONS FOR USE  1. Sit on the edge of your bed if possible, or sit up as far as you can in bed or on a chair. 2. Hold the incentive spirometer in an upright position. 3. Breathe out normally. 4. Place the mouthpiece in your mouth and seal your lips tightly around  it. 5. Breathe in slowly and as deeply as possible, raising the piston or the ball toward the top of the column. 6. Hold your breath for 3-5 seconds or for as long as possible. Allow the piston or ball to fall to the bottom of the column. 7. Remove the mouthpiece from your mouth and breathe out normally. 8. Rest for a few seconds and repeat Steps 1 through 7 at least 10 times every 1-2 hours when you are awake. Take your time and take a few normal breaths between  deep breaths. 9. The spirometer may include an indicator to show your best effort. Use the indicator as a goal to work toward during each repetition. 10. After each set of 10 deep breaths, practice coughing to be sure your lungs are clear. If you have an incision (the cut made at the time of surgery), support your incision when coughing by placing a pillow or rolled up towels firmly against it. Once you are able to get out of bed, walk around indoors and cough well. You may stop using the incentive spirometer when instructed by your caregiver.  RISKS AND COMPLICATIONS  Take your time so you do not get dizzy or light-headed.  If you are in pain, you may need to take or ask for pain medication before doing incentive spirometry. It is harder to take a deep breath if you are having pain. AFTER USE  Rest and breathe slowly and easily.  It can be helpful to keep track of a log of your progress. Your caregiver can provide you with a simple table to help with this. If you are using the spirometer at home, follow these instructions: SEEK MEDICAL CARE IF:   You are having difficultly using the spirometer.  You have trouble using the spirometer as often as instructed.  Your pain medication is not giving enough relief while using the spirometer.  You develop fever of 100.5 F (38.1 C) or higher. SEEK IMMEDIATE MEDICAL CARE IF:   You cough up bloody sputum that had not been present before.  You develop fever of 102 F (38.9 C) or  greater.  You develop worsening pain at or near the incision site. MAKE SURE YOU:   Understand these instructions.  Will watch your condition.  Will get help right away if you are not doing well or get worse. Document Released: 02/07/2007 Document Revised: 12/20/2011 Document Reviewed: 04/10/2007 [facility] Patient Information 2014 [place], [place].   ________________________________________________________________________

## 2016-12-06 ENCOUNTER — Encounter (HOSPITAL_COMMUNITY): Payer: Self-pay

## 2016-12-06 ENCOUNTER — Encounter (INDEPENDENT_AMBULATORY_CARE_PROVIDER_SITE_OTHER): Payer: Self-pay

## 2016-12-06 ENCOUNTER — Encounter (HOSPITAL_COMMUNITY)
Admission: RE | Admit: 2016-12-06 | Discharge: 2016-12-06 | Disposition: A | Discharge status: Home / Self Care | Payer: Medicare Other | Source: Ambulatory Visit | Attending: Orthopedic Surgery | Admitting: Orthopedic Surgery

## 2016-12-06 DIAGNOSIS — Z01812 Encounter for preprocedural laboratory examination: Secondary | ICD-10-CM | POA: Insufficient documentation

## 2016-12-06 DIAGNOSIS — M1612 Unilateral primary osteoarthritis, left hip: Secondary | ICD-10-CM | POA: Insufficient documentation

## 2016-12-06 LAB — COMPREHENSIVE METABOLIC PANEL
ALBUMIN: 4 g/dL (ref 3.5–5.0)
ALK PHOS: 88 U/L (ref 38–126)
ALT: 11 U/L — AB (ref 14–54)
ANION GAP: 7 (ref 5–15)
AST: 17 U/L (ref 15–41)
BUN: 19 mg/dL (ref 6–20)
CALCIUM: 9.5 mg/dL (ref 8.9–10.3)
CHLORIDE: 105 mmol/L (ref 101–111)
CO2: 28 mmol/L (ref 22–32)
Creatinine, Ser: 0.97 mg/dL (ref 0.44–1.00)
GFR calc non Af Amer: 51 mL/min — ABNORMAL LOW (ref >60)
GFR, EST AFRICAN AMERICAN: 59 mL/min — AB (ref >60)
GLUCOSE: 112 mg/dL — AB (ref 65–99)
Potassium: 4.1 mmol/L (ref 3.5–5.1)
Sodium: 140 mmol/L (ref 135–145)
Total Bilirubin: 0.7 mg/dL (ref 0.3–1.2)
Total Protein: 7.3 g/dL (ref 6.5–8.1)

## 2016-12-06 LAB — CBC
HCT: 40 % (ref 36.0–46.0)
HEMOGLOBIN: 13.4 g/dL (ref 12.0–15.0)
MCH: 30.9 pg (ref 26.0–34.0)
MCHC: 33.5 g/dL (ref 30.0–36.0)
MCV: 92.2 fL (ref 78.0–100.0)
PLATELETS: 258 10*3/uL (ref 150–400)
RBC: 4.34 MIL/uL (ref 3.87–5.11)
RDW: 13 % (ref 11.5–15.5)
WBC: 8.4 10*3/uL (ref 4.0–10.5)

## 2016-12-06 LAB — SURGICAL PCR SCREEN
MRSA, PCR: NEGATIVE
Staphylococcus aureus: NEGATIVE

## 2016-12-06 LAB — PROTIME-INR
INR: 1.02
Prothrombin Time: 13.4 seconds (ref 11.4–15.2)

## 2016-12-06 LAB — APTT: APTT: 27 s (ref 24–36)

## 2016-12-06 NOTE — Progress Notes (Addendum)
EKG-11/30/16 on chart along with clearance from Dr  Zachery ConchFriedman on chart  Along with LOV.

## 2016-12-10 ENCOUNTER — Ambulatory Visit: Payer: Self-pay | Admitting: Orthopedic Surgery

## 2016-12-10 NOTE — H&P (Signed)
Melissa Harris DOB: 07/12/1928 Widowed / Language: English / Race: White Female Date of Admission:  12/15/2016  CC:  Left hip pain History of Present Illness The patient is a 83 year old female who comes in  for a preoperative History and Physical. The patient is scheduled for a left total hip arthroplasty (anterior) to be performed by Dr. Frank V. Aluisio, MD at [facility] on 12-15-2016. The patient is a 83 year old female who presented for follow up of their left hip. The patient is being followed for their left hip pain. They are now months out from IA injection. Symptoms reported include: pain. The patient is still having left hip pain, radiating to left knee(left TKA slp 1 year, 10 months)) and report their pain level to be mild to moderate (pain varies). The following medication has been used for pain control: antiinflammatory medication (Aleve) and Tylenol. The patient has reported temporary improvement of their symptoms with: Cortisone injections.  She did have an intraarticular injection, which helped for a short amount of time. It helped with both the hip and knee pain. It is getting harder for her to get around. The hip is really limiting her. She is now at a point where she would like to proceed with surgical intervention. They have been treated conservatively in the past for the above stated problem and despite conservative measures, they continue to have progressive pain and severe functional limitations and dysfunction. They have failed non-operative management including home exercise, medications, and injections. It is felt that they would benefit from undergoing total joint replacement. Risks and benefits of the procedure have been discussed with the patient and they elect to proceed with surgery. There are no active contraindications to surgery such as ongoing infection or rapidly progressive neurological disease.   Problem List/Past Medical Status post total right knee  replacement (Z96.651)  Primary osteoarthritis of left knee (M17.12)  Primary osteoarthritis of left hip (M16.12)  Degenerative lumbar disc (M51.36)  Impaired Vision  Impaired Hearing  Right Ear Depression  Osteoarthritis  Chronic Pain  Gastroesophageal Reflux Disease  High blood pressure  Fibromyalgia  Asthma  Glaucoma  Cataract  Macular Degeneration  Diverticulosis  Measles  Mumps  Allergies Timoptic *OPHTHALMIC AGENTS*  Not an allergy to this particular drop, just an allergy to a preservative in the eye drops she used to use.  Family History Osteoarthritis  Mother. mother Asthma  Dementia  Father  Deceased. age 92 Mother  Deceased, Dementia. age 93  Social History  Children  5 or more Tobacco use  Never smoker. 07/10/2014, never smoker Tobacco / smoke exposure  07/10/2014: no, no Drug/Alcohol Rehab (Previously)  no Marital status  widowed Pain Contract  no Illicit drug use  no No history of drug/alcohol rehab  Drug/Alcohol Rehab (Currently)  no Not under pain contract  Current work status  retired Alcohol use  never consumed alcohol Living situation  live alone Exercise  Exercises daily; does other Post-Surgical Plans  Home - Plan is to do In-Home VERA Therapy Advance Directives  Living Will  Medication History Tylenol (325MG Tablet, 1 (one) Oral) Active. Aleve (220MG Tablet, Oral) Active. Aspirin (325MG Tablet DR, Oral) Active. Omeprazole (40MG Capsule DR, Oral) Active. Timolol Maleate (0.5% Solution, Ophthalmic) Active. Latanoprost (0.005% Solution, Ophthalmic) Active. Metoprolol Tartrate (25MG Tablet, 75mg Oral daily) Active. Tums (Oral as needed) Specific strength unknown - Active. Women's One A Day Active. Vitamin B12 Active. Vitamin D3 Active.  Past Surgical History  Total Hip   Replacement  right Total Knee Replacement  right Cataract Surgery  bilateral Appendectomy  Gallbladder Surgery   open Tonsillectomy  Total Knee Replacement - Left     Review of Systems  General Present- Fatigue. Not Present- Chills, Fever, Memory Loss, Night Sweats, Weight Gain and Weight Loss. Skin Not Present- Eczema, Hives, Itching, Lesions and Rash. HEENT Not Present- Dentures, Double Vision, Headache, Hearing Loss, Tinnitus and Visual Loss. Respiratory Not Present- Allergies, Chronic Cough, Coughing up blood, Shortness of breath at rest and Shortness of breath with exertion. Cardiovascular Not Present- Chest Pain, Difficulty Breathing Lying Down, Murmur, Palpitations, Racing/skipping heartbeats and Swelling. Gastrointestinal Present- Heartburn. Not Present- Abdominal Pain, Bloody Stool, Constipation, Diarrhea, Difficulty Swallowing, Jaundice, Loss of appetitie, Nausea and Vomiting. Female Genitourinary Present- Urinary frequency and Urinating at Night. Not Present- Blood in Urine, Discharge, Flank Pain, Incontinence, Painful Urination, Urgency, Urinary Retention and Weak urinary stream. Musculoskeletal Present- Back Pain, Joint Pain and Muscle Pain. Not Present- Joint Swelling, Morning Stiffness, Muscle Weakness and Spasms. Neurological Not Present- Blackout spells, Difficulty with balance, Dizziness, Paralysis, Tremor and Weakness. Psychiatric Not Present- Insomnia.  Vitals  Weight: 174 lb Height: 65in Body Surface Area: 1.86 m Body Mass Index: 28.95 kg/m  Pulse: 72 (Regular)  BP: 122/78 (Sitting, Left Arm, Standard   Physical Exam General Mental Status -Alert, cooperative and good historian. General Appearance-pleasant, Not in acute distress. Orientation-Oriented X3. Build & Nutrition-Well nourished and Well developed.  Head and Neck Head-normocephalic, atraumatic . Neck Global Assessment - supple, no bruit auscultated on the right, no bruit auscultated on the left.  Eye Pupil - Bilateral-Regular and Round. Motion - Bilateral-EOMI.  Chest and Lung  Exam Auscultation Breath sounds - clear at anterior chest wall and clear at posterior chest wall. Adventitious sounds - No Adventitious sounds.  Cardiovascular Auscultation Rhythm - Regular rate and rhythm. Heart Sounds - S1 WNL and S2 WNL. Murmurs & Other Heart Sounds - Auscultation of the heart reveals - No Murmurs.  Abdomen Palpation/Percussion Tenderness - Abdomen is non-tender to palpation. Rigidity (guarding) - Abdomen is soft. Auscultation Auscultation of the abdomen reveals - Bowel sounds normal.  Female Genitourinary Note: Not done, not pertinent to present illness   Musculoskeletal Note: On exam, she is in no distress. Her left hip can be flexed to about 100 with minimal internal rotation, no external rotation, about 20 degrees of abduction. She has no pain at all with normal range of motion of her right hip.  RADIOGRAPHS AP pelvis and lateral of left hip, bone-on-bone arthritis with subchondral cystic formation.  Assessment & Plan Primary osteoarthritis of left hip (M16.12)  Note:Surgical Plans: Left Total Hip Replacement - Anterior Approach  Disposition: Home with In-Home VERA Therapy  PCP: [facility] Cards: [facility] ([place]) - Dr. Douglas Friedman - Patient has been seen preoperatively and felt to be stable for surgery. "Recommend continuing metoprolol perioperatively".  IV TXA  Patient was instructed on what medications to stop prior to surgery.  Signed electronically by Alezandrew L Perkins, III PA-C  

## 2016-12-15 ENCOUNTER — Inpatient Hospital Stay (HOSPITAL_COMMUNITY): Payer: Medicare Other | Admitting: Certified Registered"

## 2016-12-15 ENCOUNTER — Encounter (HOSPITAL_COMMUNITY)
Admission: RE | Disposition: A | Discharge status: Home Health | Payer: Self-pay | Source: Ambulatory Visit | Attending: Orthopedic Surgery

## 2016-12-15 ENCOUNTER — Encounter (HOSPITAL_COMMUNITY): Payer: Self-pay | Admitting: *Deleted

## 2016-12-15 ENCOUNTER — Inpatient Hospital Stay (HOSPITAL_COMMUNITY): Payer: Medicare Other

## 2016-12-15 ENCOUNTER — Inpatient Hospital Stay (HOSPITAL_COMMUNITY)
Admission: RE | Admit: 2016-12-15 | Discharge: 2016-12-18 | DRG: 470 | Disposition: A | Discharge status: Home Health | Payer: Medicare Other | Source: Ambulatory Visit | Attending: Orthopedic Surgery | Admitting: Orthopedic Surgery

## 2016-12-15 DIAGNOSIS — Z96641 Presence of right artificial hip joint: Secondary | ICD-10-CM | POA: Diagnosis present

## 2016-12-15 DIAGNOSIS — T50905A Adverse effect of unspecified drugs, medicaments and biological substances, initial encounter: Secondary | ICD-10-CM | POA: Diagnosis not present

## 2016-12-15 DIAGNOSIS — H9190 Unspecified hearing loss, unspecified ear: Secondary | ICD-10-CM | POA: Diagnosis present

## 2016-12-15 DIAGNOSIS — F05 Delirium due to known physiological condition: Secondary | ICD-10-CM | POA: Diagnosis not present

## 2016-12-15 DIAGNOSIS — M1612 Unilateral primary osteoarthritis, left hip: Secondary | ICD-10-CM | POA: Diagnosis present

## 2016-12-15 DIAGNOSIS — T4145XA Adverse effect of unspecified anesthetic, initial encounter: Secondary | ICD-10-CM | POA: Diagnosis not present

## 2016-12-15 DIAGNOSIS — M797 Fibromyalgia: Secondary | ICD-10-CM | POA: Diagnosis present

## 2016-12-15 DIAGNOSIS — Z7982 Long term (current) use of aspirin: Secondary | ICD-10-CM

## 2016-12-15 DIAGNOSIS — Y92230 Patient room in hospital as the place of occurrence of the external cause: Secondary | ICD-10-CM | POA: Diagnosis not present

## 2016-12-15 DIAGNOSIS — I10 Essential (primary) hypertension: Secondary | ICD-10-CM | POA: Diagnosis present

## 2016-12-15 DIAGNOSIS — H3530 Unspecified macular degeneration: Secondary | ICD-10-CM | POA: Diagnosis present

## 2016-12-15 DIAGNOSIS — Z96653 Presence of artificial knee joint, bilateral: Secondary | ICD-10-CM | POA: Diagnosis present

## 2016-12-15 DIAGNOSIS — Z79899 Other long term (current) drug therapy: Secondary | ICD-10-CM | POA: Diagnosis not present

## 2016-12-15 DIAGNOSIS — M169 Osteoarthritis of hip, unspecified: Secondary | ICD-10-CM | POA: Diagnosis present

## 2016-12-15 DIAGNOSIS — G8929 Other chronic pain: Secondary | ICD-10-CM | POA: Diagnosis present

## 2016-12-15 DIAGNOSIS — Z791 Long term (current) use of non-steroidal anti-inflammatories (NSAID): Secondary | ICD-10-CM

## 2016-12-15 DIAGNOSIS — K219 Gastro-esophageal reflux disease without esophagitis: Secondary | ICD-10-CM | POA: Diagnosis present

## 2016-12-15 DIAGNOSIS — H409 Unspecified glaucoma: Secondary | ICD-10-CM | POA: Diagnosis present

## 2016-12-15 DIAGNOSIS — J45909 Unspecified asthma, uncomplicated: Secondary | ICD-10-CM | POA: Diagnosis present

## 2016-12-15 DIAGNOSIS — Z96649 Presence of unspecified artificial hip joint: Secondary | ICD-10-CM

## 2016-12-15 HISTORY — PX: TOTAL HIP ARTHROPLASTY: SHX124

## 2016-12-15 SURGERY — ARTHROPLASTY, HIP, TOTAL, ANTERIOR APPROACH
Anesthesia: Monitor Anesthesia Care | Site: Hip | Laterality: Left

## 2016-12-15 MED ORDER — METHOCARBAMOL 500 MG PO TABS
500.0000 mg | ORAL_TABLET | Freq: Four times a day (QID) | ORAL | Status: DC | PRN
  Administered 2016-12-18: 10:00:00 500 mg via ORAL
  Filled 2016-12-15: qty 1

## 2016-12-15 MED ORDER — LACTATED RINGERS IV SOLN
INTRAVENOUS | Status: DC
  Administered 2016-12-15 (×2): via INTRAVENOUS

## 2016-12-15 MED ORDER — PHENOL 1.4 % MT LIQD: 1.0000 | OROMUCOSAL | Status: DC | PRN

## 2016-12-15 MED ORDER — ONDANSETRON HCL 4 MG/2ML IJ SOLN
INTRAMUSCULAR | Status: DC | PRN
  Administered 2016-12-15: 4 mg via INTRAVENOUS

## 2016-12-15 MED ORDER — METOPROLOL TARTRATE 25 MG PO TABS: 25.0000 mg | ORAL_TABLET | ORAL | Status: DC

## 2016-12-15 MED ORDER — PHENYLEPHRINE HCL 10 MG/ML IJ SOLN
INTRAMUSCULAR | Status: DC | PRN
  Administered 2016-12-15: 25 ug/min via INTRAVENOUS

## 2016-12-15 MED ORDER — RIVAROXABAN 10 MG PO TABS
10.0000 mg | ORAL_TABLET | Freq: Every day | ORAL | Status: DC
  Administered 2016-12-16 – 2016-12-18 (×3): 10 mg via ORAL
  Filled 2016-12-15 (×3): qty 1

## 2016-12-15 MED ORDER — ACETAMINOPHEN 500 MG PO TABS
1000.0000 mg | ORAL_TABLET | Freq: Four times a day (QID) | ORAL | Status: AC
  Administered 2016-12-16 (×4): 1000 mg via ORAL
  Filled 2016-12-15 (×4): qty 2

## 2016-12-15 MED ORDER — DIPHENHYDRAMINE HCL 12.5 MG/5ML PO ELIX: 12.5000 mg | ORAL_SOLUTION | ORAL | Status: DC | PRN

## 2016-12-15 MED ORDER — FENTANYL CITRATE (PF) 100 MCG/2ML IJ SOLN: 25.0000 ug | INTRAMUSCULAR | Status: DC | PRN

## 2016-12-15 MED ORDER — CEFAZOLIN SODIUM-DEXTROSE 2-4 GM/100ML-% IV SOLN
2.0000 g | Freq: Four times a day (QID) | INTRAVENOUS | Status: AC
  Administered 2016-12-15 – 2016-12-16 (×2): 2 g via INTRAVENOUS
  Filled 2016-12-15 (×2): qty 100

## 2016-12-15 MED ORDER — CHLORHEXIDINE GLUCONATE 4 % EX LIQD: 60.0000 mL | Freq: Once | CUTANEOUS | Status: DC

## 2016-12-15 MED ORDER — FENTANYL CITRATE (PF) 100 MCG/2ML IJ SOLN
INTRAMUSCULAR | Status: AC
  Filled 2016-12-15: qty 2

## 2016-12-15 MED ORDER — TIMOLOL MALEATE 0.5 % OP SOLN
1.0000 [drp] | Freq: Every day | OPHTHALMIC | Status: DC
  Administered 2016-12-16 – 2016-12-18 (×3): 1 [drp] via OPHTHALMIC
  Filled 2016-12-15: qty 5

## 2016-12-15 MED ORDER — TRANEXAMIC ACID 1000 MG/10ML IV SOLN
1000.0000 mg | INTRAVENOUS | Status: AC
  Administered 2016-12-15: 1000 mg via INTRAVENOUS
  Filled 2016-12-15: qty 1100

## 2016-12-15 MED ORDER — ALBUMIN HUMAN 5 % IV SOLN
INTRAVENOUS | Status: DC | PRN
  Administered 2016-12-15: 17:00:00 via INTRAVENOUS

## 2016-12-15 MED ORDER — FENTANYL CITRATE (PF) 100 MCG/2ML IJ SOLN
INTRAMUSCULAR | Status: DC | PRN
  Administered 2016-12-15 (×2): 50 ug via INTRAVENOUS

## 2016-12-15 MED ORDER — ACETAMINOPHEN 325 MG PO TABS: 650.0000 mg | ORAL_TABLET | Freq: Four times a day (QID) | ORAL | Status: DC | PRN

## 2016-12-15 MED ORDER — MORPHINE SULFATE (PF) 4 MG/ML IV SOLN: 1.0000 mg | INTRAVENOUS | Status: DC | PRN

## 2016-12-15 MED ORDER — PHENYLEPHRINE HCL 10 MG/ML IJ SOLN
INTRAMUSCULAR | Status: AC
  Filled 2016-12-15: qty 2

## 2016-12-15 MED ORDER — PROPOFOL 500 MG/50ML IV EMUL
INTRAVENOUS | Status: DC | PRN
  Administered 2016-12-15: 50 ug/kg/min via INTRAVENOUS

## 2016-12-15 MED ORDER — PROPOFOL 10 MG/ML IV BOLUS
INTRAVENOUS | Status: AC
  Filled 2016-12-15: qty 20

## 2016-12-15 MED ORDER — POLYETHYLENE GLYCOL 3350 17 G PO PACK: 17.0000 g | PACK | Freq: Every day | ORAL | Status: DC | PRN

## 2016-12-15 MED ORDER — MENTHOL 3 MG MT LOZG: 1.0000 | LOZENGE | OROMUCOSAL | Status: DC | PRN

## 2016-12-15 MED ORDER — LIDOCAINE 2% (20 MG/ML) 5 ML SYRINGE
INTRAMUSCULAR | Status: DC | PRN
  Administered 2016-12-15: 40 mg via INTRAVENOUS

## 2016-12-15 MED ORDER — SUCCINYLCHOLINE CHLORIDE 200 MG/10ML IV SOSY
PREFILLED_SYRINGE | INTRAVENOUS | Status: AC
  Filled 2016-12-15: qty 10

## 2016-12-15 MED ORDER — DEXAMETHASONE SODIUM PHOSPHATE 10 MG/ML IJ SOLN
10.0000 mg | Freq: Once | INTRAMUSCULAR | Status: AC
  Administered 2016-12-15: 10 mg via INTRAVENOUS

## 2016-12-15 MED ORDER — METOPROLOL TARTRATE 25 MG PO TABS
25.0000 mg | ORAL_TABLET | Freq: Every day | ORAL | Status: DC
  Administered 2016-12-16 – 2016-12-18 (×2): 25 mg via ORAL
  Filled 2016-12-15 (×3): qty 1

## 2016-12-15 MED ORDER — BUPIVACAINE HCL (PF) 0.25 % IJ SOLN
INTRAMUSCULAR | Status: DC | PRN
  Administered 2016-12-15: 30 mL

## 2016-12-15 MED ORDER — MIDAZOLAM HCL 2 MG/2ML IJ SOLN
INTRAMUSCULAR | Status: AC
  Filled 2016-12-15: qty 2

## 2016-12-15 MED ORDER — ONDANSETRON HCL 4 MG/2ML IJ SOLN
INTRAMUSCULAR | Status: AC
  Filled 2016-12-15: qty 4

## 2016-12-15 MED ORDER — ONDANSETRON HCL 4 MG PO TABS: 4.0000 mg | ORAL_TABLET | Freq: Four times a day (QID) | ORAL | Status: DC | PRN

## 2016-12-15 MED ORDER — BISACODYL 10 MG RE SUPP: 10.0000 mg | Freq: Every day | RECTAL | Status: DC | PRN

## 2016-12-15 MED ORDER — METOPROLOL TARTRATE 50 MG PO TABS
50.0000 mg | ORAL_TABLET | Freq: Every day | ORAL | Status: DC
  Administered 2016-12-15 – 2016-12-17 (×3): 50 mg via ORAL
  Filled 2016-12-15 (×3): qty 1

## 2016-12-15 MED ORDER — DEXTROSE 5 % IV SOLN
500.0000 mg | Freq: Four times a day (QID) | INTRAVENOUS | Status: DC | PRN
  Administered 2016-12-15: 500 mg via INTRAVENOUS
  Filled 2016-12-15: qty 5
  Filled 2016-12-15: qty 550

## 2016-12-15 MED ORDER — LATANOPROST 0.005 % OP SOLN
1.0000 [drp] | Freq: Every day | OPHTHALMIC | Status: DC
  Administered 2016-12-15 – 2016-12-17 (×3): 1 [drp] via OPHTHALMIC
  Filled 2016-12-15: qty 2.5

## 2016-12-15 MED ORDER — TRAMADOL HCL 50 MG PO TABS
50.0000 mg | ORAL_TABLET | Freq: Four times a day (QID) | ORAL | Status: DC | PRN
  Administered 2016-12-17: 13:00:00 50 mg via ORAL
  Administered 2016-12-18: 08:00:00 100 mg via ORAL
  Filled 2016-12-15 (×2): qty 2

## 2016-12-15 MED ORDER — ROCURONIUM BROMIDE 50 MG/5ML IV SOSY
PREFILLED_SYRINGE | INTRAVENOUS | Status: AC
  Filled 2016-12-15: qty 10

## 2016-12-15 MED ORDER — DEXAMETHASONE SODIUM PHOSPHATE 10 MG/ML IJ SOLN
INTRAMUSCULAR | Status: AC
  Filled 2016-12-15: qty 2

## 2016-12-15 MED ORDER — ONDANSETRON HCL 4 MG/2ML IJ SOLN: 4.0000 mg | Freq: Four times a day (QID) | INTRAMUSCULAR | Status: DC | PRN

## 2016-12-15 MED ORDER — DOCUSATE SODIUM 100 MG PO CAPS
100.0000 mg | ORAL_CAPSULE | Freq: Two times a day (BID) | ORAL | Status: DC
  Administered 2016-12-15 – 2016-12-18 (×6): 100 mg via ORAL
  Filled 2016-12-15 (×6): qty 1

## 2016-12-15 MED ORDER — ACETAMINOPHEN 10 MG/ML IV SOLN
1000.0000 mg | Freq: Once | INTRAVENOUS | Status: AC
  Administered 2016-12-15: 1000 mg via INTRAVENOUS

## 2016-12-15 MED ORDER — ACETAMINOPHEN 650 MG RE SUPP: 650.0000 mg | Freq: Four times a day (QID) | RECTAL | Status: DC | PRN

## 2016-12-15 MED ORDER — EPHEDRINE 5 MG/ML INJ
INTRAVENOUS | Status: AC
  Filled 2016-12-15: qty 10

## 2016-12-15 MED ORDER — PROPOFOL 10 MG/ML IV BOLUS
INTRAVENOUS | Status: DC | PRN
  Administered 2016-12-15 (×4): 10 mg via INTRAVENOUS

## 2016-12-15 MED ORDER — BUPIVACAINE IN DEXTROSE 0.75-8.25 % IT SOLN
INTRATHECAL | Status: DC | PRN
  Administered 2016-12-15: 2 mL via INTRATHECAL

## 2016-12-15 MED ORDER — METOCLOPRAMIDE HCL 5 MG PO TABS: 5.0000 mg | ORAL_TABLET | Freq: Three times a day (TID) | ORAL | Status: DC | PRN

## 2016-12-15 MED ORDER — DEXAMETHASONE SODIUM PHOSPHATE 10 MG/ML IJ SOLN
10.0000 mg | Freq: Once | INTRAMUSCULAR | Status: AC
  Administered 2016-12-16: 09:00:00 10 mg via INTRAVENOUS
  Filled 2016-12-15: qty 1

## 2016-12-15 MED ORDER — METOCLOPRAMIDE HCL 5 MG/ML IJ SOLN: 5.0000 mg | Freq: Three times a day (TID) | INTRAMUSCULAR | Status: DC | PRN

## 2016-12-15 MED ORDER — BUPIVACAINE HCL (PF) 0.25 % IJ SOLN
INTRAMUSCULAR | Status: AC
  Filled 2016-12-15: qty 30

## 2016-12-15 MED ORDER — ACETAMINOPHEN 10 MG/ML IV SOLN
INTRAVENOUS | Status: AC
  Filled 2016-12-15: qty 100

## 2016-12-15 MED ORDER — ONDANSETRON HCL 4 MG/2ML IJ SOLN: 4.0000 mg | Freq: Once | INTRAMUSCULAR | Status: DC | PRN

## 2016-12-15 MED ORDER — LIDOCAINE 2% (20 MG/ML) 5 ML SYRINGE
INTRAMUSCULAR | Status: AC
  Filled 2016-12-15: qty 5

## 2016-12-15 MED ORDER — TRANEXAMIC ACID 1000 MG/10ML IV SOLN
1000.0000 mg | Freq: Once | INTRAVENOUS | Status: AC
  Administered 2016-12-15: 1000 mg via INTRAVENOUS
  Filled 2016-12-15: qty 1100
  Filled 2016-12-15: qty 10

## 2016-12-15 MED ORDER — CEFAZOLIN SODIUM-DEXTROSE 2-4 GM/100ML-% IV SOLN
2.0000 g | INTRAVENOUS | Status: AC
  Administered 2016-12-15: 2 g via INTRAVENOUS
  Filled 2016-12-15: qty 100

## 2016-12-15 MED ORDER — PANTOPRAZOLE SODIUM 40 MG PO TBEC: 80.0000 mg | DELAYED_RELEASE_TABLET | Freq: Every day | ORAL | Status: DC | PRN

## 2016-12-15 MED ORDER — OXYCODONE HCL 5 MG PO TABS
5.0000 mg | ORAL_TABLET | ORAL | Status: DC | PRN
  Administered 2016-12-15: 21:00:00 5 mg via ORAL
  Administered 2016-12-16 – 2016-12-17 (×3): 10 mg via ORAL
  Filled 2016-12-15: qty 2
  Filled 2016-12-15: qty 1
  Filled 2016-12-15 (×2): qty 2
  Filled 2016-12-15 (×2): qty 1

## 2016-12-15 MED ORDER — SODIUM CHLORIDE 0.9 % IV SOLN
INTRAVENOUS | Status: DC
  Administered 2016-12-15: 21:00:00 via INTRAVENOUS

## 2016-12-15 MED ORDER — FLEET ENEMA 7-19 GM/118ML RE ENEM: 1.0000 | ENEMA | Freq: Once | RECTAL | Status: DC | PRN

## 2016-12-15 MED ORDER — SODIUM CHLORIDE 0.9 % IV SOLN
INTRAVENOUS | Status: DC | PRN
  Administered 2016-12-15: 18:00:00 via INTRAVENOUS

## 2016-12-15 SURGICAL SUPPLY — 37 items
BAG DECANTER FOR FLEXI CONT (MISCELLANEOUS) ×3 IMPLANT
BAG SPEC THK2 15X12 ZIP CLS (MISCELLANEOUS)
BAG ZIPLOCK 12X15 (MISCELLANEOUS) IMPLANT
BLADE SAG 18X100X1.27 (BLADE) ×3 IMPLANT
CAPT HIP TOTAL 2 ×2 IMPLANT
CLOSURE WOUND 1/2 X4 (GAUZE/BANDAGES/DRESSINGS) ×1
CLOTH BEACON ORANGE TIMEOUT ST (SAFETY) ×3 IMPLANT
COVER PERINEAL POST (MISCELLANEOUS) ×3 IMPLANT
DECANTER SPIKE VIAL GLASS SM (MISCELLANEOUS) ×3 IMPLANT
DRAPE STERI IOBAN 125X83 (DRAPES) ×3 IMPLANT
DRAPE U-SHAPE 47X51 STRL (DRAPES) ×6 IMPLANT
DRSG ADAPTIC 3X8 NADH LF (GAUZE/BANDAGES/DRESSINGS) ×3 IMPLANT
DRSG MEPILEX BORDER 4X4 (GAUZE/BANDAGES/DRESSINGS) ×3 IMPLANT
DRSG MEPILEX BORDER 4X8 (GAUZE/BANDAGES/DRESSINGS) ×3 IMPLANT
DURAPREP 26ML APPLICATOR (WOUND CARE) ×3 IMPLANT
ELECT REM PT RETURN 9FT ADLT (ELECTROSURGICAL) ×3
ELECTRODE REM PT RTRN 9FT ADLT (ELECTROSURGICAL) ×1 IMPLANT
EVACUATOR 1/8 PVC DRAIN (DRAIN) ×3 IMPLANT
GLOVE BIO SURGEON STRL SZ7.5 (GLOVE) ×3 IMPLANT
GLOVE BIO SURGEON STRL SZ8 (GLOVE) ×6 IMPLANT
GLOVE BIOGEL PI IND STRL 8 (GLOVE) ×2 IMPLANT
GLOVE BIOGEL PI INDICATOR 8 (GLOVE) ×4
GOWN STRL REUS W/TWL LRG LVL3 (GOWN DISPOSABLE) ×3 IMPLANT
GOWN STRL REUS W/TWL XL LVL3 (GOWN DISPOSABLE) ×3 IMPLANT
NS IRRIG 1000ML POUR BTL (IV SOLUTION) ×2 IMPLANT
PACK ANTERIOR HIP CUSTOM (KITS) ×3 IMPLANT
STRIP CLOSURE SKIN 1/2X4 (GAUZE/BANDAGES/DRESSINGS) ×2 IMPLANT
SUT ETHIBOND NAB CT1 #1 30IN (SUTURE) ×3 IMPLANT
SUT MNCRL AB 4-0 PS2 18 (SUTURE) ×3 IMPLANT
SUT VIC AB 2-0 CT1 27 (SUTURE) ×6
SUT VIC AB 2-0 CT1 TAPERPNT 27 (SUTURE) ×2 IMPLANT
SUT VLOC 180 0 24IN GS25 (SUTURE) ×3 IMPLANT
SYR 50ML LL SCALE MARK (SYRINGE) IMPLANT
TRAY FOLEY W/METER SILVER 14FR (SET/KITS/TRAYS/PACK) ×2 IMPLANT
TRAY FOLEY W/METER SILVER 16FR (SET/KITS/TRAYS/PACK) ×3 IMPLANT
WATER STERILE IRR 1000ML POUR (IV SOLUTION) ×4 IMPLANT
YANKAUER SUCT BULB TIP 10FT TU (MISCELLANEOUS) ×3 IMPLANT

## 2016-12-15 NOTE — Anesthesia Procedure Notes (Signed)
Spinal  Patient location during procedure: OR Start time: 12/15/2016 4:46 PM End time: 12/15/2016 4:52 PM Staffing Anesthesiologist: Cecile HearingURK, STEPHEN EDWARD Performed: anesthesiologist  Preanesthetic Checklist Completed: patient identified, surgical consent, pre-op evaluation, timeout performed, IV checked, risks and benefits discussed and monitors and equipment checked Spinal Block Patient position: sitting Prep: site prepped and draped and DuraPrep Patient monitoring: continuous pulse ox and blood pressure Approach: midline Location: L3-4 Needle Needle type: Pencan and Sprotte  Needle gauge: 24 G Needle length: 9 cm Catheter type: closed end Assessment Sensory level: T8 Additional Notes Functioning IV was confirmed and monitors were applied. Sterile prep and drape, including hand hygiene, mask and sterile gloves were used. The patient was positioned and the spine was prepped. The skin was anesthetized with lidocaine.  Free flow of clear CSF was obtained prior to injecting local anesthetic into the CSF.  The spinal needle aspirated freely following injection.  The needle was carefully withdrawn.  The patient tolerated the procedure well. Consent was obtained prior to procedure with all questions answered and concerns addressed. Risks including but not limited to bleeding, infection, nerve damage, paralysis, failed block, inadequate analgesia, allergic reaction, high spinal, itching and headache were discussed and the patient wished to proceed.   Arrie AranStephen Turk, MD

## 2016-12-15 NOTE — Interval H&P Note (Signed)
History and Physical Interval Note:  12/15/2016 2:52 PM  Melissa HartJoan M Samford  has presented today for surgery, with the diagnosis of Osteoarthritis Left Hip  The various methods of treatment have been discussed with the patient and family. After consideration of risks, benefits and other options for treatment, the patient has consented to  Procedure(s) with comments: LEFT TOTAL HIP ARTHROPLASTY ANTERIOR APPROACH (Left) - requests 105mins as a surgical intervention .  The patient's history has been reviewed, patient examined, no change in status, stable for surgery.  I have reviewed the patient's chart and labs.  Questions were answered to the patient's satisfaction.     Loanne DrillingALUISIO,Tamie Minteer V

## 2016-12-15 NOTE — Op Note (Signed)
OPERATIVE REPORT- TOTAL HIP ARTHROPLASTY   PREOPERATIVE DIAGNOSIS: Osteoarthritis of the Left hip.   POSTOPERATIVE DIAGNOSIS: Osteoarthritis of the Left  hip.   PROCEDURE: Left total hip arthroplasty, anterior approach.   SURGEON: Ollen GrossFrank Chijioke Lasser, MD   ASSISTANT: Avel Peacerew Perkins, PA-C  ANESTHESIA:  Spinal  ESTIMATED BLOOD LOSS:-200 ml   DRAINS: Hemovac x1.   COMPLICATIONS: None   CONDITION: PACU - hemodynamically stable.   BRIEF CLINICAL NOTE: Melissa Harris is a 83 y.o. female who has advanced end-  stage arthritis of their Left  hip with progressively worsening pain and  dysfunction.The patient has failed nonoperative management and presents for  total hip arthroplasty.   PROCEDURE IN DETAIL: After successful administration of spinal  anesthetic, the traction boots [facility] were placed on both  feet and the [facility], boots placed into the leg  holders. The Left hip was then isolated from the perineum with plastic  drapes and prepped and draped in the usual sterile fashion. ASIS and  greater trochanter were marked and a oblique incision was made, starting  at about 1 cm lateral and 2 cm distal to the ASIS and coursing towards  the anterior cortex of the femur. The skin was cut with a 10 blade  through subcutaneous tissue to the level of the fascia overlying the  tensor fascia lata muscle. The fascia was then incised in line with the  incision at the junction of the anterior third and posterior 2/3rd. The  muscle was teased off the fascia and then the interval between the TFL  and the rectus was developed. The Hohmann retractor was then placed at  the top of the femoral neck over the capsule. The vessels overlying the  capsule were cauterized and the fat on top of the capsule was removed.  A Hohmann retractor was then placed anterior underneath the rectus  femoris to give exposure to the entire anterior capsule. A T-shaped   capsulotomy was performed. The edges were tagged and the femoral head  was identified.       Osteophytes are removed off the superior acetabulum.  The femoral neck was then cut in situ with an oscillating saw. Traction  was then applied to the left lower extremity utilizing the [facility]  traction. The femoral head was then removed. Retractors were placed  around the acetabulum and then circumferential removal of the labrum was  performed. Osteophytes were also removed. Reaming starts at 43 mm to  medialize and  Increased in 2 mm increments to 47 mm. We reamed in  approximately 40 degrees of abduction, 20 degrees anteversion. A 48 mm  pinnacle acetabular shell was then impacted in anatomic position under  fluoroscopic guidance with excellent purchase. We did not need to place  any additional dome screws. A 28 mm neutral + 4 marathon liner was then  placed into the acetabular shell.       The femoral lift was then placed along the lateral aspect of the femur  just distal to the vastus ridge. The leg was  externally rotated and capsule  was stripped off the inferior aspect of the femoral neck down to the  level of the lesser trochanter, this was done with electrocautery. The femur was lifted after this was performed. The  leg was then placed in an extended and adducted position essentially delivering the femur. We also removed the capsule superiorly and the piriformis from the piriformis fossa  to gain excellent exposure of the  proximal femur. Rongeur was used to remove some cancellous bone to get  into the lateral portion of the proximal femur for placement of the  initial starter reamer. The starter broaches was placed  the starter broach  and was shown to go down the center of the canal. Broaching  with the  Corail system was then performed starting at size 8, coursing  Up to size 10. A size 10 had excellent torsional and rotational  and axial stability. The trial high offset neck was then  placed  with a 28 + 5 trial head. The hip was then reduced. We confirmed that  the stem was in the canal both on AP and lateral x-rays. It also has excellent sizing. The hip was reduced with outstanding stability through full extension and full external rotation.. AP pelvis was taken and the leg lengths were measured and found to be equal. Hip was then dislocated again and the femoral head and neck removed. The  femoral broach was removed. Size 10 Corail stem with a high offset  neck was then impacted into the femur following native anteversion. Has  excellent purchase in the canal. Excellent torsional and rotational and  axial stability. It is confirmed to be in the canal on AP and lateral  fluoroscopic views. The 28 + 5 ceramic head was placed and the hip  reduced with outstanding stability. Again AP pelvis was taken and it  confirmed that the leg lengths were equal. The wound was then copiously  irrigated with saline solution and the capsule reattached and repaired  with Ethibond suture. 30 ml of .25% Bupivicaine was  injected into the capsule and into the edge of the tensor fascia lata as well as subcutaneous tissue. The fascia overlying the tensor fascia lata was then closed with a running #1 V-Loc. Subcu was closed with interrupted 2-0 Vicryl and subcuticular running 4-0 Monocryl. Incision was cleaned  and dried. Steri-Strips and a bulky sterile dressing applied. Hemovac  drain was hooked to suction and then the patient was awakened and transported to  recovery in stable condition.        Please note that a surgical assistant was a medical necessity for this procedure to perform it in a safe and expeditious manner. Assistant was necessary to provide appropriate retraction of vital neurovascular structures and to prevent femoral fracture and allow for anatomic placement of the prosthesis.  Ollen Gross, M.D.

## 2016-12-15 NOTE — H&P (View-Only) (Signed)
Melissa GulaJoan M Harris DOB: 1928-03-27 Widowed / Language: [place] / Race: White Female Date of Admission:  12/15/2016  CC:  Left hip pain History of Present Illness The patient is a 83 year old female who comes in  for a preoperative History and Physical. The patient is scheduled for a left total hip arthroplasty (anterior) to be performed by Dr. Gus RankinFrank V. Aluisio, [facility]15-[facility]. The patient is a 83 year old female who presented for follow up of their left hip. The patient is being followed for their left hip pain. They are now months out from IA injection. Symptoms reported include: pain. The patient is still having left hip pain, radiating to left knee(left TKA slp 1 year, 10 months)) and report their pain level to be mild to moderate (pain varies). The following medication has been used for pain control: antiinflammatory medication (Aleve) and Tylenol. The patient has reported temporary improvement of their symptoms with: Cortisone injections.  She did have an intraarticular injection, which helped for a short amount of time. It helped with both the hip and knee pain. It is getting harder for her to get around. The hip is really limiting her. She is now at a point where she would like to proceed with surgical intervention. They have been treated conservatively in the past for the above stated problem and despite conservative measures, they continue to have progressive pain and severe functional limitations and dysfunction. They have failed non-operative management including home exercise, medications, and injections. It is felt that they would benefit from undergoing total joint replacement. Risks and benefits of the procedure have been discussed with the patient and they elect to proceed with surgery. There are no active contraindications to surgery such as ongoing infection or rapidly progressive neurological disease.   Problem List/Past Medical Status post total right knee  replacement (R60.454(Z96.651)  Primary osteoarthritis of left knee (M17.12)  Primary osteoarthritis of left hip (M16.12)  Degenerative lumbar disc (M51.36)  Impaired Vision  Impaired Hearing  Right Ear Depression  Osteoarthritis  Chronic Pain  Gastroesophageal Reflux Disease  High blood pressure  Fibromyalgia  Asthma  Glaucoma  Cataract  Macular Degeneration  Diverticulosis  Measles  Mumps  Allergies Timoptic *OPHTHALMIC AGENTS*  Not an allergy to this particular drop, just an allergy to a preservative in the eye drops she used to use.  Family History Osteoarthritis  Mother. mother Asthma  Dementia  Father  Deceased. age 81 Mother  Deceased, Dementia. age 81  Social History  Children  5 or more Tobacco use  Never smoker. 07/10/2014, never smoker Tobacco / smoke exposure  07/10/2014: no, no Drug/Alcohol Rehab (Previously)  no Marital status  widowed Pain Contract  no Illicit drug use  no No history of drug/alcohol rehab  Drug/Alcohol Rehab (Currently)  no Not under pain contract  Current work status  retired Alcohol use  never consumed alcohol Living situation  live alone Exercise  Exercises daily; does other Post-Surgical Plans  Home - Plan is to do [facility] Advance Directives  Living Will  Medication History Tylenol (325MG  Tablet, 1 (one) Oral) Active. Aleve (220MG  Tablet, Oral) Active. Aspirin (325MG  Tablet DR, Oral) Active. Omeprazole (40MG  Capsule DR, Oral) Active. Timolol Maleate (0.5% Solution, Ophthalmic) Active. Latanoprost (0.005% Solution, Ophthalmic) Active. Metoprolol Tartrate (25MG  Tablet, 75mg  Oral daily) Active. Tums (Oral as needed) Specific strength unknown - Active. Women's One A Day Active. Vitamin B12 Active. Vitamin D3 Active.  Past Surgical History  Total Hip  Replacement  right Total Knee Replacement  right Cataract Surgery  bilateral Appendectomy  Gallbladder Surgery   open Tonsillectomy  Total Knee Replacement - Left     Review of Systems  General Present- Fatigue. Not Present- Chills, Fever, Memory Loss, Night Sweats, Weight Gain and Weight Loss. Skin Not Present- Eczema, Hives, Itching, Lesions and Rash. HEENT Not Present- Dentures, Double Vision, Headache, Hearing Loss, Tinnitus and Visual Loss. Respiratory Not Present- Allergies, Chronic Cough, Coughing up blood, Shortness of breath at rest and Shortness of breath with exertion. Cardiovascular Not Present- Chest Pain, Difficulty Breathing Lying Down, Murmur, Palpitations, Racing/skipping heartbeats and Swelling. Gastrointestinal Present- Heartburn. Not Present- Abdominal Pain, Bloody Stool, Constipation, Diarrhea, Difficulty Swallowing, Jaundice, Loss of appetitie, Nausea and Vomiting. Female Genitourinary Present- Urinary frequency and Urinating at Night. Not Present- Blood in Urine, Discharge, Flank Pain, Incontinence, Painful Urination, Urgency, Urinary Retention and Weak urinary stream. Musculoskeletal Present- Back Pain, Joint Pain and Muscle Pain. Not Present- Joint Swelling, Morning Stiffness, Muscle Weakness and Spasms. Neurological Not Present- Blackout spells, Difficulty with balance, Dizziness, Paralysis, Tremor and Weakness. Psychiatric Not Present- Insomnia.  Vitals  Weight: 174 lb Height: 65in Body Surface Area: 1.86 m Body Mass Index: 28.95 kg/m  Pulse: 72 (Regular)  BP: 122/78 (Sitting, Left Arm, Standard   Physical Exam General Mental Status -Alert, cooperative and good historian. General Appearance-pleasant, Not in acute distress. Orientation-Oriented X3. Build & Nutrition-Well nourished and Well developed.  Head and Neck Head-normocephalic, atraumatic . Neck Global Assessment - supple, no bruit auscultated on the right, no bruit auscultated on the left.  Eye Pupil - Bilateral-Regular and Round. Motion - Bilateral-EOMI.  Chest and Lung  Exam Auscultation Breath sounds - clear at anterior chest wall and clear at posterior chest wall. Adventitious sounds - No Adventitious sounds.  Cardiovascular Auscultation Rhythm - Regular rate and rhythm. Heart Sounds - S1 WNL and S2 WNL. Murmurs & Other Heart Sounds - Auscultation of the heart reveals - No Murmurs.  Abdomen Palpation/Percussion Tenderness - Abdomen is non-tender to palpation. Rigidity (guarding) - Abdomen is soft. Auscultation Auscultation of the abdomen reveals - Bowel sounds normal.  Female Genitourinary Note: Not done, not pertinent to present illness   Musculoskeletal Note: On exam, she is in no distress. Her left hip can be flexed to about 100 with minimal internal rotation, no external rotation, about 20 degrees of abduction. She has no pain at all with normal range of motion of her right hip.  RADIOGRAPHS AP pelvis and lateral of left hip, bone-on-bone arthritis with subchondral cystic formation.  Assessment & Plan Primary osteoarthritis of left hip (M16.12)  Note:Surgical Plans: Left Total Hip Replacement - Anterior Approach  Disposition: Home with In-Home VERA Therapy  PCP: [facility] Cards: [facility] Kathryne Sharper) - Dr. Ginette Otto - Patient has been seen preoperatively and felt to be stable for surgery. "Recommend continuing metoprolol perioperatively".  IV TXA  Patient was instructed on what medications to stop prior to surgery.  Signed electronically by Beckey Rutter, III PA-C

## 2016-12-15 NOTE — Anesthesia Preprocedure Evaluation (Addendum)
Anesthesia Evaluation  Patient identified by MRN, date of birth, ID band Patient awake    Reviewed: Allergy & Precautions, NPO status , Patient's Chart, lab work & pertinent test results  Airway Mallampati: II  TM Distance: >3 FB Neck ROM: Full    Dental  (+) Teeth Intact, Dental Advisory Given, Chipped, Poor Dentition,    Pulmonary neg pulmonary ROS,    Pulmonary exam normal breath sounds clear to auscultation       Cardiovascular hypertension, Pt. on home beta blockers (-) angina(-) CAD, (-) Past MI and (-) CHF Normal cardiovascular exam Rhythm:Regular Rate:Normal     Neuro/Psych PSYCHIATRIC DISORDERS Depression negative neurological ROS     GI/Hepatic Neg liver ROS, GERD  Medicated,  Endo/Other  negative endocrine ROS  Renal/GU negative Renal ROS     Musculoskeletal  (+) Arthritis ,   Abdominal   Peds  Hematology negative hematology ROS (+) Plt 258k   Anesthesia Other Findings Day of surgery medications reviewed with the patient.  Reproductive/Obstetrics                            Anesthesia Physical Anesthesia Plan  ASA: III  Anesthesia Plan: Spinal and MAC   Post-op Pain Management:    Induction: Intravenous  Airway Management Planned: Simple Face Mask  Additional Equipment:   Intra-op Plan:   Post-operative Plan:   Informed Consent: I have reviewed the patients History and Physical, chart, labs and discussed the procedure including the risks, benefits and alternatives for the proposed anesthesia with the patient or authorized representative who has indicated his/her understanding and acceptance.   Dental advisory given  Plan Discussed with: CRNA, Anesthesiologist and Surgeon  Anesthesia Plan Comments: (Discussed risks and benefits of and differences between spinal and general. Discussed risks of spinal including headache, backache, failure, bleeding, infection, and  nerve damage. Patient consents to spinal. Questions answered. Coagulation studies and platelet count acceptable.  NO VERSED)       Anesthesia Quick Evaluation

## 2016-12-15 NOTE — Transfer of Care (Signed)
Immediate Anesthesia Transfer of Care Note  Patient: Melissa Harris  Procedure(s) Performed: Procedure(s) with comments: LEFT TOTAL HIP ARTHROPLASTY ANTERIOR APPROACH (Left) - requests 105mins  Patient Location: PACU  Anesthesia Type:Regional  Level of Consciousness: awake and alert   Airway & Oxygen Therapy: Patient Spontanous Breathing and Patient connected to face mask oxygen  Post-op Assessment: Report given to RN and Post -op Vital signs reviewed and stable  Post vital signs: Reviewed and stable  Last Vitals:  Vitals:   12/15/16 1331  BP: 114/81  Pulse: 73  Resp: 16  Temp: 36.8 C    Last Pain:  Vitals:   12/15/16 1331  TempSrc: Oral  PainSc:       Patients Stated Pain Goal: 4 (12/15/16 1311)  Complications: No apparent anesthesia complications

## 2016-12-16 ENCOUNTER — Encounter (HOSPITAL_COMMUNITY): Payer: Self-pay | Admitting: Orthopedic Surgery

## 2016-12-16 LAB — CBC
HCT: 32.1 % — ABNORMAL LOW (ref 36.0–46.0)
HEMOGLOBIN: 10.9 g/dL — AB (ref 12.0–15.0)
MCH: 30.4 pg (ref 26.0–34.0)
MCHC: 34 g/dL (ref 30.0–36.0)
MCV: 89.7 fL (ref 78.0–100.0)
Platelets: 195 10*3/uL (ref 150–400)
RBC: 3.58 MIL/uL — AB (ref 3.87–5.11)
RDW: 12.6 % (ref 11.5–15.5)
WBC: 10.4 10*3/uL (ref 4.0–10.5)

## 2016-12-16 LAB — TYPE AND SCREEN
ABO/RH(D): B POS
ANTIBODY SCREEN: NEGATIVE

## 2016-12-16 LAB — BASIC METABOLIC PANEL
Anion gap: 8 (ref 5–15)
BUN: 12 mg/dL (ref 6–20)
CHLORIDE: 106 mmol/L (ref 101–111)
CO2: 26 mmol/L (ref 22–32)
CREATININE: 0.97 mg/dL (ref 0.44–1.00)
Calcium: 8.2 mg/dL — ABNORMAL LOW (ref 8.9–10.3)
GFR calc Af Amer: 59 mL/min — ABNORMAL LOW (ref >60)
GFR calc non Af Amer: 51 mL/min — ABNORMAL LOW (ref >60)
GLUCOSE: 153 mg/dL — AB (ref 65–99)
POTASSIUM: 3.7 mmol/L (ref 3.5–5.1)
SODIUM: 140 mmol/L (ref 135–145)

## 2016-12-16 NOTE — Progress Notes (Signed)
[facility] to review dc plan for this medicare bundle pt. . PT has recommended SNF at d/c. Noreene LarssonJill reports that she had made plans with pt several weeks ago to have In - Home VERA Therapy following surgery.  Noreene LarssonJill reports that pt was in agreement with this plan and was going to arrange for assistance at home since she lives alone.  MD has not approved SNF placement at this time.  CSW is available to assist with SNF placement with MD's approval.  Cori RazorJamie Sabiha Sura LCSW [phone]

## 2016-12-16 NOTE — Progress Notes (Signed)
   Subjective: 1 Day Post-Op Procedure(s) (LRB): LEFT TOTAL HIP ARTHROPLASTY ANTERIOR APPROACH (Left) Patient reports pain as mild.   Patient seen in rounds with Dr. Lequita HaltAluisio. Patient is well, but has had some minor complaints of pain in the hip, requiring pain medications We will start therapy today.  Plan is to go Home after hospital stay. She will be using the In-Home VERA Therapy program.  Objective: Vital signs in last 24 hours: Temp:  [97.5 F (36.4 C)-98.3 F (36.8 C)] 97.9 F (36.6 C) (03/08 0522) Pulse Rate:  [58-73] 58 (03/08 0522) Resp:  [12-17] 13 (03/08 0522) BP: (114-157)/(51-81) 130/58 (03/08 0522) SpO2:  [96 %-100 %] 100 % (03/08 0522) Weight:  [76.2 kg (168 lb)] 76.2 kg (168 lb) (03/07 1311)  Intake/Output from previous day:  Intake/Output Summary (Last 24 hours) at 12/16/16 0826 Last data filed at 12/16/16 0630  Gross per 24 hour  Intake           2425.5 ml  Output             1210 ml  Net           1215.5 ml    Intake/Output this shift: No intake/output data recorded.  Labs:  Recent Labs  12/16/16 0431  HGB 10.9*    Recent Labs  12/16/16 0431  WBC 10.4  RBC 3.58*  HCT 32.1*  PLT 195    Recent Labs  12/16/16 0431  NA 140  K 3.7  CL 106  CO2 26  BUN 12  CREATININE 0.97  GLUCOSE 153*  CALCIUM 8.2*   No results for input(s): LABPT, INR in the last 72 hours.  EXAM General - Patient is Alert, Appropriate and Oriented Extremity - Neurovascular intact Sensation intact distally Intact pulses distally Dorsiflexion/Plantar flexion intact Dressing - dressing C/D/I Motor Function - intact, moving foot and toes well on exam.  Hemovac pulled without difficulty.  Past Medical History:  Diagnosis Date  . Carpal tunnel syndrome    left   . Depression   . GERD (gastroesophageal reflux disease)   . Hypertension   . Nocturia   . Osteoarthritis     Assessment/Plan: 1 Day Post-Op Procedure(s) (LRB): LEFT TOTAL HIP ARTHROPLASTY  ANTERIOR APPROACH (Left) Principal Problem:   OA (osteoarthritis) of hip  Estimated body mass index is 27.96 kg/m as calculated from the following:   Height as of this encounter: 5\' 5"  (1.651 m).   Weight as of this encounter: 76.2 kg (168 lb). Up with therapy Plan for discharge tomorrow  In-Home VERA Therapy Program  DVT Prophylaxis - Xarelto Weight Bearing As Tolerated left Leg Hemovac Pulled Begin Therapy  Avel Peacerew Perkins, PA-C Orthopaedic Surgery 12/16/2016, 8:26 AM

## 2016-12-16 NOTE — Progress Notes (Signed)
Physical Therapy Treatment Patient Details Name: AZALYNN MAXIM MRN: [id number] DOB: 16-Jun-1928 Today's Date: 12/16/2016    History of Present Illness Pt s/p L THR and with hx of bil TKR and R THR    PT Comments    Pt progressing with mobility but continues impulsive and with questionable safety awareness.     Follow Up Recommendations  SNF     Equipment Recommendations  None recommended by PT    Recommendations for Other Services OT consult     Precautions / Restrictions Precautions Precautions: Fall Restrictions Weight Bearing Restrictions: No Other Position/Activity Restrictions: WBAT    Mobility  Bed Mobility Overal bed mobility: Needs Assistance Bed Mobility: Sit to Supine     Supine to sit: Min assist Sit to supine: Min assist   General bed mobility comments: cues for sequence and use of R LE to self assist  Transfers Overall transfer level: Needs assistance Equipment used: Rolling walker (2 wheeled) Transfers: Sit to/from Stand Sit to Stand: Min assist         General transfer comment: cues for LE management and use of UEs to self assist  Ambulation/Gait Ambulation/Gait assistance: Min assist Ambulation Distance (Feet): 164 Feet Assistive device: Rolling walker (2 wheeled) Gait Pattern/deviations: Step-to pattern;Step-through pattern;Decreased step length - right;Decreased step length - left;Shuffle;Trunk flexed Gait velocity: decr Gait velocity interpretation: Below normal speed for age/gender General Gait Details: cues for posture, position from RW, basic safety awareness and initial sequence   Stairs            Wheelchair Mobility    Modified Rankin (Stroke Patients Only)       Balance                                    Cognition Arousal/Alertness: Awake/alert Behavior During Therapy: WFL for tasks assessed/performed Overall Cognitive Status: Within Functional Limits for tasks assessed                  General Comments: Pt impulsive and requiring frequent redirection to focus on task    Exercises      General Comments        Pertinent Vitals/Pain Pain Assessment: 0-10 Pain Score: 3  Pain Location: L hip Pain Descriptors / Indicators: Aching;Sore Pain Intervention(s): Limited activity within patient's tolerance;Monitored during session;Premedicated before session;Ice applied    Home Living Family/patient expects to be discharged to:: Skilled nursing facility Living Arrangements: Alone Available Help at Discharge: Family Type of Home: House Home Access: Stairs to enter   Home Layout: One level Home Equipment: Environmental consultant - 2 wheels      Prior Function Level of Independence: Independent          PT Goals (current goals can now be found in the care plan section) Acute Rehab PT Goals Patient Stated Goal: Rehab to regain IND PT Goal Formulation: With patient Time For Goal Achievement: 12/22/16 Potential to Achieve Goals: Good    Frequency    7X/week      PT Plan Current plan remains appropriate    Co-evaluation             End of Session Equipment Utilized During Treatment: Gait belt Activity Tolerance: Patient tolerated treatment well Patient left: in bed;with call bell/phone within reach Nurse Communication: Mobility status PT Visit Diagnosis: Difficulty in walking, not elsewhere classified (R26.2)     Time: 4098-1191 PT Time Calculation (min) (ACUTE  ONLY): 20 min  Charges:  $Gait Training: 8-22 mins                    G Codes:       Emorie Mcfate 12/16/2016, 5:19 PM

## 2016-12-16 NOTE — Discharge Instructions (Addendum)
° °Dr. Frank Aluisio °Total Joint Specialist °[facility] °[address] °[place], [place] [address] °[phone] ° °ANTERIOR APPROACH TOTAL HIP REPLACEMENT POSTOPERATIVE DIRECTIONS ° ° °Hip Rehabilitation, Guidelines Following Surgery  °The results of a hip operation are greatly improved after range of motion and muscle strengthening exercises. Follow all safety measures which are given to protect your hip. If any of these exercises cause increased pain or swelling in your joint, decrease the amount until you are comfortable again. Then slowly increase the exercises. Call your caregiver if you have problems or questions.  ° °HOME CARE INSTRUCTIONS  °Remove items at home which could result in a fall. This includes throw rugs or furniture in walking pathways.  °· ICE to the affected hip every three hours for 30 minutes at a time and then as needed for pain and swelling.  Continue to use ice on the hip for pain and swelling from surgery. You may notice swelling that will progress down to the foot and ankle.  This is normal after surgery.  Elevate the leg when you are not up walking on it.   °· Continue to use the breathing machine which will help keep your temperature down.  It is common for your temperature to cycle up and down following surgery, especially at night when you are not up moving around and exerting yourself.  The breathing machine keeps your lungs expanded and your temperature down. ° ° °DIET °You may resume your previous home diet once your are discharged from the hospital. ° °DRESSING / WOUND CARE / SHOWERING °You may shower 3 days after surgery, but keep the wounds dry during showering.  You may use an occlusive plastic wrap (Press'n Seal for example), NO SOAKING/SUBMERGING IN THE BATHTUB.  If the bandage gets wet, change with a clean dry gauze.  If the incision gets wet, pat the wound dry with a clean towel. °You may start showering once you are discharged home but do not  submerge the incision under water. Just pat the incision dry and apply a dry gauze dressing on daily. °Change the surgical dressing daily and reapply a dry dressing each time. ° °ACTIVITY °Walk with your walker as instructed. °Use walker as long as suggested by your caregivers. °Avoid periods of inactivity such as sitting longer than an hour when not asleep. This helps prevent blood clots.  °You may resume a sexual relationship in one month or when given the OK by your doctor.  °You may return to work once you are cleared by your doctor.  °Do not drive a car for 6 weeks or until released by you surgeon.  °Do not drive while taking narcotics. ° °WEIGHT BEARING °Weight bearing as tolerated with assist device (walker, cane, etc) as directed, use it as long as suggested by your surgeon or therapist, typically at least 4-6 weeks. ° °POSTOPERATIVE CONSTIPATION PROTOCOL °Constipation - defined medically as fewer than three stools per week and severe constipation as less than one stool per week. ° °One of the most common issues patients have following surgery is constipation.  Even if you have a regular bowel pattern at home, your normal regimen is likely to be disrupted due to multiple reasons following surgery.  Combination of anesthesia, postoperative narcotics, change in appetite and fluid intake all can affect your bowels.  In order to avoid complications following surgery, here are some recommendations in order to help you during your recovery period. ° °Colace (docusate) - Pick up an over-the-counter   form of Colace or another stool softener and take twice a day as long as you are requiring postoperative pain medications.  Take with a full glass of water daily.  If you experience loose stools or diarrhea, hold the colace until you stool forms back up.  If your symptoms do not get better within 1 week or if they get worse, check with your doctor. ° °Dulcolax (bisacodyl) - Pick up over-the-counter and take as directed  by the product packaging as needed to assist with the movement of your bowels.  Take with a full glass of water.  Use this product as needed if not relieved by Colace only.  ° °MiraLax (polyethylene glycol) - Pick up over-the-counter to have on hand.  MiraLax is a solution that will increase the amount of water in your bowels to assist with bowel movements.  Take as directed and can mix with a glass of water, juice, soda, coffee, or tea.  Take if you go more than two days without a movement. °Do not use MiraLax more than once per day. Call your doctor if you are still constipated or irregular after using this medication for 7 days in a row. ° °If you continue to have problems with postoperative constipation, please contact the office for further assistance and recommendations.  If you experience "the worst abdominal pain ever" or develop nausea or vomiting, please contact the office immediatly for further recommendations for treatment. ° °ITCHING ° If you experience itching with your medications, try taking only a single pain pill, or even half a pain pill at a time.  You can also use Benadryl over the counter for itching or also to help with sleep.  ° °TED HOSE STOCKINGS °Wear the elastic stockings on both legs for three weeks following surgery during the day but you may remove then at night for sleeping. ° °MEDICATIONS °See your medication summary on the “After Visit Summary” that the nursing staff will review with you prior to discharge.  You may have some home medications which will be placed on hold until you complete the course of blood thinner medication.  It is important for you to complete the blood thinner medication as prescribed by your surgeon.  Continue your approved medications as instructed at time of discharge. ° °PRECAUTIONS °If you experience chest pain or shortness of breath - call 911 immediately for transfer to the hospital emergency department.  °If you develop a fever greater that 101 F,  purulent drainage from wound, increased redness or drainage from wound, foul odor from the wound/dressing, or calf pain - CONTACT YOUR SURGEON.   °                                                °FOLLOW-UP APPOINTMENTS °Make sure you keep all of your appointments after your operation with your surgeon and caregivers. You should call the office at the above phone number and make an appointment for approximately two weeks after the date of your surgery or on the date instructed by your surgeon outlined in the "After Visit Summary". ° °RANGE OF MOTION AND STRENGTHENING EXERCISES  °These exercises are designed to help you keep full movement of your hip joint. Follow your caregiver's or physical therapist's instructions. Perform all exercises about fifteen times, three times per day or as directed. Exercise both hips, even if you   have had only one joint replacement. These exercises can be done on a training (exercise) mat, on the floor, on a table or on a bed. Use whatever works the best and is most comfortable for you. Use music or television while you are exercising so that the exercises are a pleasant break in your day. This will make your life better with the exercises acting as a break in routine you can look forward to.  Lying on your back, slowly slide your foot toward your buttocks, raising your knee up off the floor. Then slowly slide your foot back down until your leg is straight again.  Lying on your back spread your legs as far apart as you can without causing discomfort.  Lying on your side, raise your upper leg and foot straight up from the floor as far as is comfortable. Slowly lower the leg and repeat.  Lying on your back, tighten up the muscle in the front of your thigh (quadriceps muscles). You can do this by keeping your leg straight and trying to raise your heel off the floor. This helps strengthen the largest muscle supporting your knee.  Lying on your back, tighten up the muscles of your  buttocks both with the legs straight and with the knee bent at a comfortable angle while keeping your heel on the floor.    MAKE SURE YOU:  Understand these instructions.  Get help right away if you are not doing well or get worse.    Pick up stool softner and laxative for home use following surgery while on pain medications. Do not submerge incision under water. Please use good hand washing techniques while changing dressing each day. May shower starting three days after surgery. Please use a clean towel to pat the incision dry following showers. Continue to use ice for pain and swelling after surgery. Do not use any lotions or creams on the incision until instructed by your surgeon.  Take Xarelto for two and a half more weeks following discharge from the hospital, then discontinue Xarelto. Once the patient has completed the blood thinner regimen, then take a Baby 81 mg Aspirin daily for three more weeks.    Information on my medicine - XARELTO (Rivaroxaban)  This medication education was reviewed with me or my healthcare representative as part of my discharge preparation.    Why was Xarelto prescribed for you? Xarelto was prescribed for you to reduce the risk of blood clots forming after orthopedic surgery. The medical term for these abnormal blood clots is venous thromboembolism (VTE).  What do you need to know about xarelto ? Take your Xarelto ONCE DAILY at the same time every day. You may take it either with or without food.  If you have difficulty swallowing the tablet whole, you may crush it and mix in applesauce just prior to taking your dose.  Take Xarelto exactly as prescribed by your doctor and DO NOT stop taking Xarelto without talking to the doctor who prescribed the medication.  Stopping without other VTE prevention medication to take the place of Xarelto may increase your risk of developing a clot.  After discharge, you should have regular check-up  appointments with your healthcare provider that is prescribing your Xarelto.    What do you do if you miss a dose? If you miss a dose, take it as soon as you remember on the same day then continue your regularly scheduled once daily regimen the next day. Do not take two doses of Xarelto  on the same day.   Important Safety Information A possible side effect of Xarelto is bleeding. You should call your healthcare provider right away if you experience any of the following: ? Bleeding from an injury or your nose that does not stop. ? Unusual colored urine (red or dark brown) or unusual colored stools (red or black). ? Unusual bruising for unknown reasons. ? A serious fall or if you hit your head (even if there is no bleeding).  Some medicines may interact with Xarelto and might increase your risk of bleeding while on Xarelto. To help avoid this, consult your healthcare provider or pharmacist prior to using any new prescription or non-prescription medications, including herbals, vitamins, non-steroidal anti-inflammatory drugs (NSAIDs) and supplements.  This website has more information on Xarelto: [email or web address]

## 2016-12-16 NOTE — Evaluation (Signed)
Occupational Therapy Evaluation Patient Details Name: Melissa Harris MRN: [id number] DOB: Jan 05, 1928 Today's Date: 12/16/2016    History of Present Illness Pt s/p L THR and with hx of bil TKR and R THR   Clinical Impression   Pt is s/p THA resulting in the deficits listed below (see OT Problem List).  Pt will benefit from skilled OT to increase their safety and independence with ADL and functional mobility for ADL to facilitate discharge to venue listed below.        Follow Up Recommendations  Home health OT;Supervision/Assistance - 24 hour;SNF  Do not feel pt is safe to be home [place] .  Pt will need A. OT did communicate this with pt   Equipment Recommendations  None recommended by OT       Precautions / Restrictions Precautions Precautions: Fall Restrictions Weight Bearing Restrictions: No Other Position/Activity Restrictions: WBAT      Mobility Bed Mobility Overal bed mobility: Needs Assistance Bed Mobility: Supine to Sit     Supine to sit: Min assist     General bed mobility comments: cues for sequence and use of R LE to self assist  Transfers Overall transfer level: Needs assistance Equipment used: Rolling walker (2 wheeled) Transfers: Sit to/from Stand Sit to Stand: Min assist;From elevated surface         General transfer comment: cues for LE management and use of UEs to self assist         ADL Overall ADL's : Needs assistance/impaired Eating/Feeding: Set up;Sitting   Grooming: Set up;Sitting   Upper Body Bathing: Minimal assistance;Sitting   Lower Body Bathing: Moderate assistance;Sit to/from stand;Cueing for safety;Cueing for sequencing;Cueing for compensatory techniques   Upper Body Dressing : Minimal assistance;Sitting   Lower Body Dressing: Moderate assistance;Sit to/from stand;Cueing for safety;Cueing for sequencing;Cueing for compensatory techniques   Toilet Transfer: Minimal assistance;RW;Ambulation;Cueing for sequencing;Cueing for  safety;BSC   Toileting- Clothing Manipulation and Hygiene: Minimal assistance;Moderate assistance;Sit to/from stand;Cueing for safety;Cueing for sequencing         General ADL Comments: pt states she doesnt have A at home. Pt wants to go to SNF to regain I. OT did commununicate with SW and Noreene Larsson, CM from MD office     Vision Patient Visual Report: No change from baseline              Pertinent Vitals/Pain Pain Assessment: 0-10 Pain Score: 3  Pain Location: L hip Pain Descriptors / Indicators: Aching;Sore Pain Intervention(s): Monitored during session;Repositioned;Ice applied     Hand Dominance     Extremity/Trunk Assessment Upper Extremity Assessment Upper Extremity Assessment: Generalized weakness   Lower Extremity Assessment Lower Extremity Assessment: LLE deficits/detail LLE Deficits / Details: strength at hip 2+/5 with AAROM at hip to 80 flex and 20 abd   Cervical / Trunk Assessment Cervical / Trunk Assessment: Kyphotic   Communication Communication Communication: No difficulties   Cognition Arousal/Alertness: Awake/alert Behavior During Therapy: WFL for tasks assessed/performed Overall Cognitive Status: Within Functional Limits for tasks assessed                 General Comments: Pt impulsive and requiring frequent redirection to focus on task              Home Living Family/patient expects to be discharged to:: Skilled nursing facility Living Arrangements: Alone Available Help at Discharge: Family Type of Home: House Home Access: Stairs to enter     Home Layout: One level     Bathroom Shower/Tub: Walk-in  shower   Bathroom Toilet: Standard     Home Equipment: Walker - 2 wheels          Prior Functioning/Environment Level of Independence: Independent                 OT Problem List: Decreased strength;Decreased activity tolerance;Pain      OT Treatment/Interventions: Self-care/ADL training;DME and/or AE  instruction;Patient/family education    OT Goals(Current goals can be found in the care plan section) Acute Rehab OT Goals Patient Stated Goal: Rehab to regain IND OT Goal Formulation: With patient Time For Goal Achievement: 12/30/16 Potential to Achieve Goals: Good  OT Frequency: Min 2X/week   Barriers to D/C:               End of Session Equipment Utilized During Treatment: Engineer, waterolling walker Nurse Communication: Mobility status  Activity Tolerance: Patient tolerated treatment well Patient left: in chair;with call bell/phone within reach;with chair alarm set  OT Visit Diagnosis: Unsteadiness on feet (R26.81);Pain;Muscle weakness (generalized) (M62.81)                ADL either performed or assessed with clinical judgement  Time: [id number] OT Time Calculation (min): 22 min Charges:  OT General Charges $OT Visit: 1 Procedure OT Evaluation $OT Eval Moderate Complexity: 1 Procedure G-Codes:     Lise AuerLori Victorious Kundinger, OT [phone]  Einar CrowEDDING, Ladanian Kelter D 12/16/2016, 2:04 PM

## 2016-12-16 NOTE — Evaluation (Signed)
Physical Therapy Evaluation Patient Details Name: Melissa Harris MRN: [id number] DOB: 11/22/1927 Today's Date: 12/16/2016   History of Present Illness  Pt s/p L THR and with hx of bil TKR and R THR  Clinical Impression  Pt s/p L THR and presents with decreased L LE strength/ROM and post op pain limiting functional mobility.  Pt would benefit from follow up rehab at SNF level to maximize IND and safety prior to return home ALONE.    Follow Up Recommendations SNF    Equipment Recommendations  None recommended by PT    Recommendations for Other Services OT consult     Precautions / Restrictions Precautions Precautions: Fall Restrictions Weight Bearing Restrictions: No Other Position/Activity Restrictions: WBAT      Mobility  Bed Mobility Overal bed mobility: Needs Assistance Bed Mobility: Supine to Sit     Supine to sit: Min assist     General bed mobility comments: cues for sequence and use of R LE to self assist  Transfers Overall transfer level: Needs assistance Equipment used: Rolling walker (2 wheeled) Transfers: Sit to/from Stand Sit to Stand: Min assist;Mod assist;From elevated surface         General transfer comment: cues for LE management and use of UEs to self assist  Ambulation/Gait Ambulation/Gait assistance: Min assist Ambulation Distance (Feet): 60 Feet Assistive device: Rolling walker (2 wheeled) Gait Pattern/deviations: Step-to pattern;Step-through pattern;Decreased step length - right;Decreased step length - left;Shuffle;Trunk flexed Gait velocity: decr Gait velocity interpretation: Below normal speed for age/gender General Gait Details: cues for posture, position from RW, basic safety awareness and initial sequence  Stairs            Wheelchair Mobility    Modified Rankin (Stroke Patients Only)       Balance                                             Pertinent Vitals/Pain Pain Assessment: 0-10 Pain Score:  5  Pain Location: L hip Pain Descriptors / Indicators: Aching;Sore Pain Intervention(s): Limited activity within patient's tolerance;Monitored during session;Premedicated before session;Ice applied    Home Living Family/patient expects to be discharged to:: Skilled nursing facility Living Arrangements: Alone                    Prior Function Level of Independence: Independent               Hand Dominance        Extremity/Trunk Assessment   Upper Extremity Assessment Upper Extremity Assessment: Overall WFL for tasks assessed    Lower Extremity Assessment Lower Extremity Assessment: LLE deficits/detail LLE Deficits / Details: strength at hip 2+/5 with AAROM at hip to 80 flex and 20 abd    Cervical / Trunk Assessment Cervical / Trunk Assessment: Kyphotic  Communication   Communication: No difficulties  Cognition Arousal/Alertness: Awake/alert Behavior During Therapy: WFL for tasks assessed/performed Overall Cognitive Status: Within Functional Limits for tasks assessed                 General Comments: Pt impulsive and requiring frequent redirection to focus on task    General Comments      Exercises Total Joint Exercises Ankle Circles/Pumps: AROM;Both;15 reps;Supine Quad Sets: AROM;Both;10 reps;Supine Heel Slides: AAROM;Left;20 reps;Supine Hip ABduction/ADduction: AAROM;Left;15 reps;Supine   Assessment/Plan    PT Assessment Patient needs continued PT services  PT Problem List Decreased strength;Decreased range of motion;Decreased activity tolerance;Decreased mobility;Decreased knowledge of use of DME;Pain       PT Treatment Interventions DME instruction;Gait training;Stair training;Functional mobility training;Therapeutic activities;Therapeutic exercise;Patient/family education    PT Goals (Current goals can be found in the Care Plan section)  Acute Rehab PT Goals Patient Stated Goal: Rehab to regain IND PT Goal Formulation: With  patient Time For Goal Achievement: 12/22/16 Potential to Achieve Goals: Good    Frequency 7X/week   Barriers to discharge Decreased caregiver support Pt states she will be home alone and would like to go to rehab setting    Co-evaluation               End of Session Equipment Utilized During Treatment: Gait belt Activity Tolerance: Patient tolerated treatment well Patient left: in chair;with call bell/phone within reach Nurse Communication: Mobility status PT Visit Diagnosis: Difficulty in walking, not elsewhere classified (R26.2)         Time: 1610-9604 PT Time Calculation (min) (ACUTE ONLY): 26 min   Charges:   PT Evaluation $PT Eval Low Complexity: 1 Procedure PT Treatments $Therapeutic Exercise: 8-22 mins   PT G Codes:         Melissa Harris 12/16/2016, 12:55 PM

## 2016-12-17 LAB — CBC
HEMATOCRIT: 32.2 % — AB (ref 36.0–46.0)
HEMOGLOBIN: 10.9 g/dL — AB (ref 12.0–15.0)
MCH: 31.1 pg (ref 26.0–34.0)
MCHC: 33.9 g/dL (ref 30.0–36.0)
MCV: 91.7 fL (ref 78.0–100.0)
Platelets: 206 10*3/uL (ref 150–400)
RBC: 3.51 MIL/uL — AB (ref 3.87–5.11)
RDW: 13 % (ref 11.5–15.5)
WBC: 15.6 10*3/uL — AB (ref 4.0–10.5)

## 2016-12-17 LAB — BASIC METABOLIC PANEL
ANION GAP: 5 (ref 5–15)
BUN: 14 mg/dL (ref 6–20)
CALCIUM: 9.3 mg/dL (ref 8.9–10.3)
CHLORIDE: 107 mmol/L (ref 101–111)
CO2: 29 mmol/L (ref 22–32)
Creatinine, Ser: 0.92 mg/dL (ref 0.44–1.00)
GFR calc non Af Amer: 54 mL/min — ABNORMAL LOW (ref >60)
Glucose, Bld: 117 mg/dL — ABNORMAL HIGH (ref 65–99)
Potassium: 4 mmol/L (ref 3.5–5.1)
Sodium: 141 mmol/L (ref 135–145)

## 2016-12-17 MED ORDER — METHOCARBAMOL 500 MG PO TABS: 500.0000 mg | ORAL_TABLET | Freq: Four times a day (QID) | ORAL | 0 refills | Status: DC | PRN

## 2016-12-17 MED ORDER — HYDROCODONE-ACETAMINOPHEN 5-325 MG PO TABS: 1.0000 | ORAL_TABLET | ORAL | Status: DC | PRN

## 2016-12-17 MED ORDER — TRAMADOL HCL 50 MG PO TABS: 50.0000 mg | ORAL_TABLET | Freq: Four times a day (QID) | ORAL | 0 refills | Status: DC | PRN

## 2016-12-17 MED ORDER — POLYETHYLENE GLYCOL 3350 17 G PO PACK
17.0000 g | PACK | Freq: Every day | ORAL | Status: DC
  Administered 2016-12-17: 16:00:00 17 g via ORAL
  Filled 2016-12-17 (×2): qty 1

## 2016-12-17 MED ORDER — RIVAROXABAN 10 MG PO TABS: 10.0000 mg | ORAL_TABLET | Freq: Every day | ORAL | 0 refills | Status: DC

## 2016-12-17 NOTE — Progress Notes (Signed)
   Subjective: 2 Days Post-Op Procedure(s) (LRB): LEFT TOTAL HIP ARTHROPLASTY ANTERIOR APPROACH (Left) Patient reports pain as mild and moderate.   Patient seen in rounds for Dr. Lequita HaltAluisio. Patient is well, but has had some minor complaints of pain in the hip, requiring pain medications We will resume therapy today.  Nursing staff noted patient was a little confused this morning but did orient easily.  Patient states she woke up in middle of the night and did not know where she was.  Likely due to pain medications and post anesthesia.  Will reduce pain meds and monitor.  If any component of sundowning, then should improved when patient gets home in her own environment with family. Plan is to go Home after hospital stay.  Objective: Vital signs in last 24 hours: Temp:  [97.9 F (36.6 C)-98.6 F (37 C)] 97.9 F (36.6 C) (03/09 0456) Pulse Rate:  [58-63] 58 (03/09 0456) Resp:  [13] 13 (03/09 0456) BP: (129-142)/(56-72) 142/56 (03/09 0456) SpO2:  [95 %-97 %] 96 % (03/09 0456)  Intake/Output from previous day:  Intake/Output Summary (Last 24 hours) at 12/17/16 0902 Last data filed at 12/17/16 0806  Gross per 24 hour  Intake           915.67 ml  Output             2200 ml  Net         -1284.33 ml    Intake/Output this shift: Total I/O In: 120 [P.O.:120] Out: 350 [Urine:350]  Labs:  Recent Labs  12/16/16 0431 12/17/16 0430  HGB 10.9* 10.9*    Recent Labs  12/16/16 0431 12/17/16 0430  WBC 10.4 15.6*  RBC 3.58* 3.51*  HCT 32.1* 32.2*  PLT 195 206    Recent Labs  12/16/16 0431 12/17/16 0430  NA 140 141  K 3.7 4.0  CL 106 107  CO2 26 29  BUN 12 14  CREATININE 0.97 0.92  GLUCOSE 153* 117*  CALCIUM 8.2* 9.3   No results for input(s): LABPT, INR in the last 72 hours.  EXAM General - Patient is Alert and Appropriate on exam and orients very easily.   Extremity - Neurovascular intact Sensation intact distally Intact pulses distally Dorsiflexion/Plantar flexion  intact Dressing - dressing C/D/I,  Incision looks good with only minimal brusing. Motor Function - intact, moving foot and toes well on exam.  Past Medical History:  Diagnosis Date  . Carpal tunnel syndrome    left   . Depression   . GERD (gastroesophageal reflux disease)   . Hypertension   . Nocturia   . Osteoarthritis     Assessment/Plan: 2 Days Post-Op Procedure(s) (LRB): LEFT TOTAL HIP ARTHROPLASTY ANTERIOR APPROACH (Left) Principal Problem:   OA (osteoarthritis) of hip  Estimated body mass index is 27.96 kg/m as calculated from the following:   Height as of this encounter: 5\' 5"  (1.651 m).   Weight as of this encounter: 76.2 kg (168 lb). Up with therapy  Keep today and change medications, reduce the pain meds. Monitor cognitive function. Plan is for home with In-Home [facility] Program  DVT Prophylaxis - Xarelto Weight Bearing As Tolerated left Leg  Avel Peacerew Perkins, PA-C Orthopaedic Surgery 12/17/2016, 9:02 AM

## 2016-12-17 NOTE — Progress Notes (Signed)
   12/17/16 1100  OT Visit Information  Last OT Received On 12/17/16  Assistance Needed +1  History of Present Illness Pt s/p L THR and with hx of bil TKR and R THR  Precautions  Precautions Fall  Pain Assessment  Pain Score 4  Pain Location L hip  Pain Descriptors / Indicators Aching;Sore  Pain Intervention(s) Limited activity within patient's tolerance;Monitored during session;Ice applied;Repositioned;Premedicated before session  Cognition  Arousal/Alertness Awake/alert  Behavior During Therapy WFL for tasks assessed/performed  Overall Cognitive Status Within Functional Limits for tasks assessed  ADL  Lower Body Bathing Supervison/ safety;Sit to/from stand  Lower Body Dressing Minimal assistance;Sit to/from Freight forwarderstand  Toilet Transfer Min guard;Ambulation;BSC;RW  Toileting- DealerClothing Manipulation and Hygiene Min guard;Sit to/from stand  General ADL Comments completed ADL.  Pt able to don underwear; assist for socks.  Pt still a little unsteady; min guard for walking to bathroom  Bed Mobility  General bed mobility comments OOB with nursing  Restrictions  Other Position/Activity Restrictions WBAT  Transfers  Equipment used Rolling walker (2 wheeled)  Sit to Stand Min guard  General transfer comment cues for UE placement  OT - End of Session  Activity Tolerance Patient tolerated treatment well  Patient left in chair;with call bell/phone within reach;with chair alarm set  OT Assessment/Plan  OT Visit Diagnosis Unsteadiness on feet (R26.81);Pain;Muscle weakness (generalized) (M62.81)  Pain - Right/Left Left  Pain - part of body Hip  Follow Up Recommendations Supervision/Assistance - 24 hour  OT Equipment 3 in 1 bedside commode  AM-PAC OT "6 Clicks" Daily Activity Outcome Measure  Help from another person eating meals? 4  Help from another person taking care of personal grooming? 3  Help from another person toileting, which includes using toliet, bedpan, or urinal? 3  Help from  another person bathing (including washing, rinsing, drying)? 3  Help from another person to put on and taking off regular upper body clothing? 3  Help from another person to put on and taking off regular lower body clothing? 3  6 Click Score 19  ADL G Code Conversion CK  OT Goal Progression  Progress towards OT goals Progressing toward goals  Acute Rehab OT Goals  Patient Stated Goal Rehab to regain IND  Time For Goal Achievement 12/30/16  OT Time Calculation  OT Start Time (ACUTE ONLY) 0958  OT Stop Time (ACUTE ONLY) 1013  OT Time Calculation (min) 15 min  OT General Charges  $OT Visit 1 Procedure  OT Treatments  $Self Care/Home Management  8-22 mins  Melissa OtterMaryellen Adriannah Harris, OTR/L [phone] 12/17/2016

## 2016-12-17 NOTE — Anesthesia Postprocedure Evaluation (Signed)
Anesthesia Post Note  Patient: Melissa Harris  Procedure(s) Performed: Procedure(s) (LRB): LEFT TOTAL HIP ARTHROPLASTY ANTERIOR APPROACH (Left)  Patient location during evaluation: PACU Anesthesia Type: MAC Level of consciousness: oriented and awake and alert Pain management: pain level controlled Vital Signs Assessment: post-procedure vital signs reviewed and stable Respiratory status: spontaneous breathing, respiratory function stable and patient connected to nasal cannula oxygen Cardiovascular status: blood pressure returned to baseline and stable Postop Assessment: no headache and no backache Anesthetic complications: no       Last Vitals:  Vitals:   12/16/16 2134 12/17/16 0456  BP:  (!) 142/56  Pulse: 62 (!) 58  Resp:  13  Temp:  36.6 C    Last Pain:  Vitals:   12/17/16 0732  TempSrc:   PainSc: 5                  Catalina Gravel

## 2016-12-17 NOTE — Progress Notes (Signed)
Physical Therapy Treatment Patient Details Name: Melissa Harris MRN: [id number] DOB: 1927/12/31 Today's Date: 12/17/2016    History of Present Illness Pt s/p L THR and with hx of bil TKR and R THR    PT Comments    Pt progressing well with mobility but with continued questionable safety awareness and impulsive actions.  Follow Up Recommendations  Home health PT     Equipment Recommendations  None recommended by PT    Recommendations for Other Services OT consult     Precautions / Restrictions Precautions Precautions: Fall Restrictions Weight Bearing Restrictions: No Other Position/Activity Restrictions: WBAT    Mobility  Bed Mobility Overal bed mobility: Needs Assistance Bed Mobility: Sit to Supine     Supine to sit: Min assist     General bed mobility comments: cues for sequence and use of R LE to self assist  Transfers Overall transfer level: Needs assistance Equipment used: Rolling walker (2 wheeled) Transfers: Sit to/from Stand Sit to Stand: Min guard         General transfer comment: cues for UE placement  Ambulation/Gait Ambulation/Gait assistance: Min guard Ambulation Distance (Feet): 380 Feet Assistive device: Rolling walker (2 wheeled) Gait Pattern/deviations: Step-to pattern;Step-through pattern;Decreased step length - right;Decreased step length - left;Shuffle;Trunk flexed Gait velocity: decr Gait velocity interpretation: Below normal speed for age/gender General Gait Details: cues for posture, position from RW, and basic safety awareness    Stairs            Wheelchair Mobility    Modified Rankin (Stroke Patients Only)       Balance                                    Cognition Arousal/Alertness: Awake/alert Behavior During Therapy: WFL for tasks assessed/performed Overall Cognitive Status: Within Functional Limits for tasks assessed                 General Comments: Pt impulsive and requiring  frequent redirection to focus on task    Exercises      General Comments        Pertinent Vitals/Pain Pain Assessment: 0-10 Pain Score: 4  Pain Location: L hip Pain Descriptors / Indicators: Aching;Sore Pain Intervention(s): Limited activity within patient's tolerance;Monitored during session;Premedicated before session;Ice applied    Home Living                      Prior Function            PT Goals (current goals can now be found in the care plan section) Acute Rehab PT Goals Patient Stated Goal: Regain IND PT Goal Formulation: With patient Time For Goal Achievement: 12/22/16 Potential to Achieve Goals: Good Progress towards PT goals: Progressing toward goals    Frequency    7X/week      PT Plan Discharge plan needs to be updated    Co-evaluation             End of Session Equipment Utilized During Treatment: Gait belt Activity Tolerance: Patient tolerated treatment well Patient left: in bed;with call bell/phone within reach;with bed alarm set Nurse Communication: Mobility status PT Visit Diagnosis: Difficulty in walking, not elsewhere classified (R26.2)     Time: 6578-4696 PT Time Calculation (min) (ACUTE ONLY): 16 min  Charges:  $Gait Training: 8-22 mins  G Codes:       Cristina Ceniceros 12/17/2016, 4:57 PM

## 2016-12-17 NOTE — Progress Notes (Signed)
Physical Therapy Treatment Patient Details Name: Melissa Harris MRN: [id number] DOB: 1928-09-06 Today's Date: 12/17/2016    History of Present Illness Pt s/p L THR and with hx of bil TKR and R THR    PT Comments    Pt performed therex program with assist.   Follow Up Recommendations  Home health PT     Equipment Recommendations  None recommended by PT    Recommendations for Other Services OT consult     Precautions / Restrictions Precautions Precautions: Fall Restrictions Weight Bearing Restrictions: No Other Position/Activity Restrictions: WBAT    Mobility  Bed Mobility               General bed mobility comments: OOB with nursing  Transfers Overall transfer level: Needs assistance Equipment used: Rolling walker (2 wheeled) Transfers: Sit to/from Stand Sit to Stand: Min guard         General transfer comment: cues for use of UEs to self assist  Ambulation/Gait Ambulation/Gait assistance: Min assist;Min guard Ambulation Distance (Feet): 300 Feet Assistive device: Rolling walker (2 wheeled) Gait Pattern/deviations: Step-to pattern;Step-through pattern;Decreased step length - right;Decreased step length - left;Shuffle;Trunk flexed Gait velocity: decr Gait velocity interpretation: Below normal speed for age/gender General Gait Details: cues for posture, position from RW, and basic safety awareness    Stairs            Wheelchair Mobility    Modified Rankin (Stroke Patients Only)       Balance                                    Cognition Arousal/Alertness: Awake/alert Behavior During Therapy: WFL for tasks assessed/performed Overall Cognitive Status: Within Functional Limits for tasks assessed                 General Comments: Pt impulsive and requiring frequent redirection to focus on task    Exercises Total Joint Exercises Ankle Circles/Pumps: AROM;Both;15 reps;Supine Quad Sets: AROM;Both;10 reps;Supine Heel  Slides: AAROM;Left;20 reps;Supine Hip ABduction/ADduction: AAROM;Left;15 reps;Supine Long Arc Quad: AROM;Right;15 reps;Seated    General Comments        Pertinent Vitals/Pain Pain Assessment: 0-10 Pain Score: 4  Faces Pain Scale: Hurts little more Pain Location: L hip Pain Descriptors / Indicators: Aching;Sore Pain Intervention(s): Limited activity within patient's tolerance;Monitored during session;Premedicated before session;Ice applied    Home Living                      Prior Function            PT Goals (current goals can now be found in the care plan section) Acute Rehab PT Goals Patient Stated Goal: Rehab to regain IND PT Goal Formulation: With patient Time For Goal Achievement: 12/22/16 Potential to Achieve Goals: Good Progress towards PT goals: Progressing toward goals    Frequency    7X/week      PT Plan Discharge plan needs to be updated    Co-evaluation             End of Session Equipment Utilized During Treatment: Gait belt Activity Tolerance: Patient tolerated treatment well Patient left: in chair;with chair alarm set Nurse Communication: Mobility status PT Visit Diagnosis: Difficulty in walking, not elsewhere classified (R26.2)     Time: [id number] PT Time Calculation (min) (ACUTE ONLY): 17 min  Charges:  $Gait Training: 8-22 mins $Therapeutic Exercise: 8-22 mins  G Codes:       Anubis Fundora 12-31-16, 10:51 AM

## 2016-12-17 NOTE — Progress Notes (Signed)
Physical Therapy Treatment Patient Details Name: Melissa Harris MRN: [id number] DOB: 12-17-1927 Today's Date: 12/17/2016    History of Present Illness Pt s/p L THR and with hx of bil TKR and R THR    PT Comments    Pt very cooperative but admits to confusion overnight and not knowing where she was and attempting to walk to bathroom without RW - pt reoriented at this time but continues impulsive and requiring cues for safety.   Follow Up Recommendations  Outpatient PT     Equipment Recommendations  None recommended by PT    Recommendations for Other Services OT consult     Precautions / Restrictions Precautions Precautions: Fall Restrictions Weight Bearing Restrictions: No Other Position/Activity Restrictions: WBAT    Mobility  Bed Mobility               General bed mobility comments: OOB with nursing  Transfers Overall transfer level: Needs assistance Equipment used: Rolling walker (2 wheeled) Transfers: Sit to/from Stand Sit to Stand: Min guard         General transfer comment: cues for use of UEs to self assist  Ambulation/Gait Ambulation/Gait assistance: Min assist;Min guard Ambulation Distance (Feet): 300 Feet Assistive device: Rolling walker (2 wheeled) Gait Pattern/deviations: Step-to pattern;Step-through pattern;Decreased step length - right;Decreased step length - left;Shuffle;Trunk flexed Gait velocity: decr Gait velocity interpretation: Below normal speed for age/gender General Gait Details: cues for posture, position from RW, and basic safety awareness    Stairs            Wheelchair Mobility    Modified Rankin (Stroke Patients Only)       Balance                                    Cognition Arousal/Alertness: Awake/alert Behavior During Therapy: WFL for tasks assessed/performed Overall Cognitive Status: Within Functional Limits for tasks assessed                 General Comments: Pt impulsive and  requiring frequent redirection to focus on task    Exercises      General Comments        Pertinent Vitals/Pain Pain Assessment: Faces Faces Pain Scale: Hurts little more Pain Location: L hip Pain Descriptors / Indicators: Aching;Sore Pain Intervention(s): Monitored during session;Limited activity within patient's tolerance;Premedicated before session;Ice applied    Home Living                      Prior Function            PT Goals (current goals can now be found in the care plan section) Acute Rehab PT Goals PT Goal Formulation: With patient Time For Goal Achievement: 12/22/16 Potential to Achieve Goals: Good Progress towards PT goals: Progressing toward goals    Frequency    7X/week      PT Plan Discharge plan needs to be updated    Co-evaluation             End of Session   Activity Tolerance: Patient tolerated treatment well Patient left: in chair;with chair alarm set;with family/visitor present Nurse Communication: Mobility status PT Visit Diagnosis: Difficulty in walking, not elsewhere classified (R26.2)     Time: [id number] PT Time Calculation (min) (ACUTE ONLY): 15 min  Charges:  $Gait Training: 8-22 mins  G Codes:       Melissa Harris 2016/12/21, 8:34 AM

## 2016-12-17 NOTE — Discharge Summary (Signed)
Physician Discharge Summary   Patient ID: Melissa Harris MRN: [id number] DOB/AGE: Mar 04, 1928 83 y.o.  Admit date: 12/15/2016 Discharge date: 12/18/2016  Primary Diagnosis:  Osteoarthritis of the Left hip.   Admission Diagnoses:  Past Medical History:  Diagnosis Date  . Carpal tunnel syndrome    left   . Depression   . GERD (gastroesophageal reflux disease)   . Hypertension   . Nocturia   . Osteoarthritis    Discharge Diagnoses:   Principal Problem:   OA (osteoarthritis) of hip  Estimated body mass index is 27.96 kg/m as calculated from the following:   Height as of this encounter: '5\' 5"'$  (1.651 m).   Weight as of this encounter: 76.2 kg (168 lb).  Procedure(s) (LRB): LEFT TOTAL HIP ARTHROPLASTY ANTERIOR APPROACH (Left)   Consults: None  HPI: Melissa Harris is a 83 y.o. female who has advanced end-  stage arthritis of their Left  hip with progressively worsening pain and  dysfunction.The patient has failed nonoperative management and presents for  total hip arthroplasty.  Laboratory Data: Admission on 12/15/2016  Component Date Value Ref Range Status  . WBC 12/16/2016 10.4  4.0 - 10.5 K/uL Final  . RBC 12/16/2016 3.58* 3.87 - 5.11 MIL/uL Final  . Hemoglobin 12/16/2016 10.9* 12.0 - 15.0 g/dL Final  . HCT 12/16/2016 32.1* 36.0 - 46.0 % Final  . MCV 12/16/2016 89.7  78.0 - 100.0 fL Final  . MCH 12/16/2016 30.4  26.0 - 34.0 pg Final  . MCHC 12/16/2016 34.0  30.0 - 36.0 g/dL Final  . RDW 12/16/2016 12.6  11.5 - 15.5 % Final  . Platelets 12/16/2016 195  150 - 400 K/uL Final  . Sodium 12/16/2016 140  135 - 145 mmol/L Final  . Potassium 12/16/2016 3.7  3.5 - 5.1 mmol/L Final  . Chloride 12/16/2016 106  101 - 111 mmol/L Final  . CO2 12/16/2016 26  22 - 32 mmol/L Final  . Glucose, Bld 12/16/2016 153* 65 - 99 mg/dL Final  . BUN 12/16/2016 12  6 - 20 mg/dL Final  . Creatinine, Ser 12/16/2016 0.97  0.44 - 1.00 mg/dL Final  . Calcium 12/16/2016 8.2* 8.9 - 10.3 mg/dL Final  .  GFR calc non Af Amer 12/16/2016 51* >60 mL/min Final  . GFR calc Af Amer 12/16/2016 59* >60 mL/min Final   Comment: (NOTE) The eGFR has been calculated using the CKD EPI equation. This calculation has not been validated in all clinical situations. eGFR's persistently <60 mL/min signify possible Chronic Kidney Disease.   . Anion gap 12/16/2016 8  5 - 15 Final  . WBC 12/17/2016 15.6* 4.0 - 10.5 K/uL Final  . RBC 12/17/2016 3.51* 3.87 - 5.11 MIL/uL Final  . Hemoglobin 12/17/2016 10.9* 12.0 - 15.0 g/dL Final  . HCT 12/17/2016 32.2* 36.0 - 46.0 % Final  . MCV 12/17/2016 91.7  78.0 - 100.0 fL Final  . MCH 12/17/2016 31.1  26.0 - 34.0 pg Final  . MCHC 12/17/2016 33.9  30.0 - 36.0 g/dL Final  . RDW 12/17/2016 13.0  11.5 - 15.5 % Final  . Platelets 12/17/2016 206  150 - 400 K/uL Final  . Sodium 12/17/2016 141  135 - 145 mmol/L Final  . Potassium 12/17/2016 4.0  3.5 - 5.1 mmol/L Final  . Chloride 12/17/2016 107  101 - 111 mmol/L Final  . CO2 12/17/2016 29  22 - 32 mmol/L Final  . Glucose, Bld 12/17/2016 117* 65 - 99 mg/dL Final  . BUN 12/17/2016  14  6 - 20 mg/dL Final  . Creatinine, Ser 12/17/2016 0.92  0.44 - 1.00 mg/dL Final  . Calcium 12/17/2016 9.3  8.9 - 10.3 mg/dL Final  . GFR calc non Af Amer 12/17/2016 54* >60 mL/min Final  . GFR calc Af Amer 12/17/2016 >60  >60 mL/min Final   Comment: (NOTE) The eGFR has been calculated using the CKD EPI equation. This calculation has not been validated in all clinical situations. eGFR's persistently <60 mL/min signify possible Chronic Kidney Disease.   Georgiann Hahn gap 12/17/2016 5  5 - 15 Final  Hospital Outpatient Visit on 12/06/2016  Component Date Value Ref Range Status  . aPTT 12/06/2016 27  24 - 36 seconds Final  . WBC 12/06/2016 8.4  4.0 - 10.5 K/uL Final  . RBC 12/06/2016 4.34  3.87 - 5.11 MIL/uL Final  . Hemoglobin 12/06/2016 13.4  12.0 - 15.0 g/dL Final  . HCT 12/06/2016 40.0  36.0 - 46.0 % Final  . MCV 12/06/2016 92.2  78.0 - 100.0 fL  Final  . MCH 12/06/2016 30.9  26.0 - 34.0 pg Final  . MCHC 12/06/2016 33.5  30.0 - 36.0 g/dL Final  . RDW 12/06/2016 13.0  11.5 - 15.5 % Final  . Platelets 12/06/2016 258  150 - 400 K/uL Final  . Sodium 12/06/2016 140  135 - 145 mmol/L Final  . Potassium 12/06/2016 4.1  3.5 - 5.1 mmol/L Final  . Chloride 12/06/2016 105  101 - 111 mmol/L Final  . CO2 12/06/2016 28  22 - 32 mmol/L Final  . Glucose, Bld 12/06/2016 112* 65 - 99 mg/dL Final  . BUN 12/06/2016 19  6 - 20 mg/dL Final  . Creatinine, Ser 12/06/2016 0.97  0.44 - 1.00 mg/dL Final  . Calcium 12/06/2016 9.5  8.9 - 10.3 mg/dL Final  . Total Protein 12/06/2016 7.3  6.5 - 8.1 g/dL Final  . Albumin 12/06/2016 4.0  3.5 - 5.0 g/dL Final  . AST 12/06/2016 17  15 - 41 U/L Final  . ALT 12/06/2016 11* 14 - 54 U/L Final  . Alkaline Phosphatase 12/06/2016 88  38 - 126 U/L Final  . Total Bilirubin 12/06/2016 0.7  0.3 - 1.2 mg/dL Final  . GFR calc non Af Amer 12/06/2016 51* >60 mL/min Final  . GFR calc Af Amer 12/06/2016 59* >60 mL/min Final   Comment: (NOTE) The eGFR has been calculated using the CKD EPI equation. This calculation has not been validated in all clinical situations. eGFR's persistently <60 mL/min signify possible Chronic Kidney Disease.   . Anion gap 12/06/2016 7  5 - 15 Final  . Prothrombin Time 12/06/2016 13.4  11.4 - 15.2 seconds Final  . INR 12/06/2016 1.02   Final  . ABO/RH(D) 12/06/2016 B POS   Final  . Antibody Screen 12/06/2016 NEG   Final  . Sample Expiration 12/06/2016 12/19/2016   Final  . Extend sample reason 12/06/2016 NO TRANSFUSIONS OR PREGNANCY IN THE PAST 3 MONTHS   Final  . MRSA, PCR 12/06/2016 NEGATIVE  NEGATIVE Final  . Staphylococcus aureus 12/06/2016 NEGATIVE  NEGATIVE Final   Comment:        The Xpert SA Assay (FDA approved for NASAL specimens in patients over 27 years of age), is one component of a comprehensive surveillance program.  Test performance has been validated by [facility] for  patients greater than or equal to 20 year old. It is not intended to diagnose infection nor to guide or monitor treatment.  X-Rays:Dg Pelvis Portable  Result Date: 12/15/2016 CLINICAL DATA:  Postop left hip arthroplasty EXAM: PORTABLE PELVIS 1-2 VIEWS COMPARISON:  557 FINDINGS: AP views of both hips and pelvis. New uncemented left hip arthroplasty with surgical drain in place since prior comparison study. The pre-existing right hip arthroplasty is stable without hardware malfunction. No postoperative fracture is identified on either side. There is lower lumbar degenerative facet arthropathy at L4-5 and L5-S1. IMPRESSION: 1. No postoperative fracture or malalignment of the visualized left total uncemented hip arthroplasty given limitations of an AP view only exam. 2. Stable right total incident hip arthroplasty. Electronically Signed   By: Ashley Royalty M.D.   On: 12/15/2016 20:38   Dg C-arm 1-60 Min-no Report  Result Date: 12/15/2016 Fluoroscopy was utilized by the requesting physician.  No radiographic interpretation.    EKG: Orders placed or performed in visit on 12/10/14  . EKG 12-Lead     Hospital Course: Patient was admitted to [facility] and taken to the OR and underwent the above state procedure without complications.  Patient tolerated the procedure well and was later transferred to the recovery room and then to the orthopaedic floor for postoperative care.  They were given PO and IV analgesics for pain control following their surgery.  They were given 24 hours of postoperative antibiotics of  Anti-infectives    Start     Dose/Rate Route Frequency Ordered Stop   12/15/16 2200  ceFAZolin (ANCEF) IVPB 2g/100 mL premix     2 g 200 mL/hr over 30 Minutes Intravenous Every 6 hours 12/15/16 1904 12/16/16 0342   12/15/16 1315  ceFAZolin (ANCEF) IVPB 2g/100 mL premix     2 g 200 mL/hr over 30 Minutes Intravenous On call to O.R. 12/15/16 1300 12/15/16 1616     and started on  DVT prophylaxis in the form of Xarelto.   PT and OT were ordered for total hip protocol.  The patient was allowed to be WBAT with therapy. Discharge planning was consulted to help with postop disposition and equipment needs.  Patient had a tough night on the evening of surgery.  They started to get up OOB with therapy on day one.  Hemovac drain was pulled without difficulty.  Continued to work with therapy into day two. She had some slight confusion on morning of day two  Dressing was changed on day two and the incision was healing well.  By day three, the patient had cleared up and had progressed with therapy but needed more work on balance.  Incision was healing well.  Patient was seen in rounds and was by the afternoon of POD 3 ready to go home.  Diet: Cardiac diet Activity:WBAT Follow-up:in 2 weeks Disposition - Home Discharged Condition: improving   Discharge Instructions    Call MD / Call 911    Complete by:  As directed    If you experience chest pain or shortness of breath, CALL 911 and be transported to the hospital emergency room.  If you develope a fever above 101 F, pus (white drainage) or increased drainage or redness at the wound, or calf pain, call your surgeon's office.   Change dressing    Complete by:  As directed    You may change your dressing dressing daily with sterile 4 x 4 inch gauze dressing and paper tape.  Do not submerge the incision under water.   Constipation Prevention    Complete by:  As directed    Drink plenty of fluids.  Prune juice may be helpful.  You may use a stool softener, such as Colace (over the counter) 100 mg twice a day.  Use MiraLax (over the counter) for constipation as needed.   Diet - low sodium heart healthy    Complete by:  As directed    Discharge instructions    Complete by:  As directed    Pick up stool softner and laxative for home use following surgery while on pain medications. Do not submerge incision under water. Please use good  hand washing techniques while changing dressing each day. May shower starting three days after surgery. Please use a clean towel to pat the incision dry following showers. Continue to use ice for pain and swelling after surgery. Do not use any lotions or creams on the incision until instructed by your surgeon.  Wear both TED hose on both legs during the day every day for three weeks, but may have off at night at home.  Postoperative Constipation Protocol  Constipation - defined medically as fewer than three stools per week and severe constipation as less than one stool per week.  One of the most common issues patients have following surgery is constipation.  Even if you have a regular bowel pattern at home, your normal regimen is likely to be disrupted due to multiple reasons following surgery.  Combination of anesthesia, postoperative narcotics, change in appetite and fluid intake all can affect your bowels.  In order to avoid complications following surgery, here are some recommendations in order to help you during your recovery period.  Colace (docusate) - Pick up an over-the-counter form of Colace or another stool softener and take twice a day as long as you are requiring postoperative pain medications.  Take with a full glass of water daily.  If you experience loose stools or diarrhea, hold the colace until you stool forms back up.  If your symptoms do not get better within 1 week or if they get worse, check with your doctor.  Dulcolax (bisacodyl) - Pick up over-the-counter and take as directed by the product packaging as needed to assist with the movement of your bowels.  Take with a full glass of water.  Use this product as needed if not relieved by Colace only.   MiraLax (polyethylene glycol) - Pick up over-the-counter to have on hand.  MiraLax is a solution that will increase the amount of water in your bowels to assist with bowel movements.  Take as directed and can mix with a glass of  water, juice, soda, coffee, or tea.  Take if you go more than two days without a movement. Do not use MiraLax more than once per day. Call your doctor if you are still constipated or irregular after using this medication for 7 days in a row.  If you continue to have problems with postoperative constipation, please contact the office for further assistance and recommendations.  If you experience "the worst abdominal pain ever" or develop nausea or vomiting, please contact the office immediatly for further recommendations for treatment.   Take Xarelto for two and a half more weeks, then discontinue Xarelto. Once the patient has completed the blood thinner regimen, then take a Baby 81 mg Aspirin daily for three more weeks.   Do not sit on low chairs, stoools or toilet seats, as it may be difficult to get up from low surfaces    Complete by:  As directed    Driving restrictions    Complete by:  As directed    No driving until released by the physician.   Increase activity slowly as tolerated    Complete by:  As directed    Lifting restrictions    Complete by:  As directed    No lifting until released by the physician.   Patient may shower    Complete by:  As directed    You may shower without a dressing once there is no drainage.  Do not wash over the wound.  If drainage remains, do not shower until drainage stops.   TED hose    Complete by:  As directed    Use stockings (TED hose) for 3 weeks on both leg(s).  You may remove them at night for sleeping.   Weight bearing as tolerated    Complete by:  As directed    Laterality:  left   Extremity:  Lower     Allergies as of 12/17/2016   No Known Allergies     Medication List    STOP taking these medications   B-12 PO   D3-1000 PO   metoCLOPramide 5 MG tablet Commonly known as:  REGLAN   multivitamin with minerals Tabs tablet   ondansetron 4 MG tablet Commonly known as:  ZOFRAN   oxyCODONE 5 MG immediate release tablet Commonly  known as:  Oxy IR/ROXICODONE   polyethylene glycol packet Commonly known as:  MIRALAX / GLYCOLAX     TAKE these medications   acetaminophen 325 MG tablet Commonly known as:  TYLENOL Take 2 tablets (650 mg total) by mouth every 6 (six) hours as needed for mild pain (or Fever >/= 101). What changed:  how much to take   bisacodyl 10 MG suppository Commonly known as:  DULCOLAX Place 1 suppository (10 mg total) rectally daily as needed for moderate constipation.   docusate sodium 100 MG capsule Commonly known as:  COLACE Take 1 capsule (100 mg total) by mouth 2 (two) times daily.   latanoprost 0.005 % ophthalmic solution Commonly known as:  XALATAN Place 1 drop into both eyes at bedtime.   methocarbamol 500 MG tablet Commonly known as:  ROBAXIN Take 1 tablet (500 mg total) by mouth every 6 (six) hours as needed for muscle spasms.   metoprolol tartrate 25 MG tablet Commonly known as:  LOPRESSOR Take 25-50 mg by mouth See admin instructions. 1 in the morning and two in the evening   omeprazole 40 MG capsule Commonly known as:  PRILOSEC Take 40 mg by mouth daily as needed (heartburn).   rivaroxaban 10 MG Tabs tablet Commonly known as:  XARELTO Take 1 tablet (10 mg total) by mouth daily with breakfast. Take Xarelto for two and a half more weeks following discharge from the hospital, then discontinue Xarelto. Once the patient has completed the blood thinner regimen, then take a Baby 81 mg Aspirin daily for three more weeks. Start taking on:  12/18/2016   timolol 0.5 % ophthalmic solution Commonly known as:  BETIMOL Place 1 drop into the right eye daily.   traMADol 50 MG tablet Commonly known as:  ULTRAM Take 1-2 tablets (50-100 mg total) by mouth every 6 (six) hours as needed for moderate pain. What changed:  reasons to take this      Follow-up Information    Gearlean Alf, MD. Schedule an appointment as soon as possible for a visit on 12/28/2016.   Specialty:  Orthopedic  Surgery Contact information: [address] [place] [address] [phone]  Signed: Arlee Muslim, PA-C Orthopaedic Surgery 12/17/2016, 9:15 AM

## 2016-12-18 LAB — CBC
HEMATOCRIT: 33.5 % — AB (ref 36.0–46.0)
Hemoglobin: 11.3 g/dL — ABNORMAL LOW (ref 12.0–15.0)
MCH: 31.3 pg (ref 26.0–34.0)
MCHC: 33.7 g/dL (ref 30.0–36.0)
MCV: 92.8 fL (ref 78.0–100.0)
Platelets: 195 10*3/uL (ref 150–400)
RBC: 3.61 MIL/uL — ABNORMAL LOW (ref 3.87–5.11)
RDW: 13.1 % (ref 11.5–15.5)
WBC: 10.2 10*3/uL (ref 4.0–10.5)

## 2016-12-18 NOTE — Care Management Important Message (Signed)
Important Message  Patient Details  Name: Melissa Harris MRN: [id number] Date of Birth: June 29, 1928   Medicare Important Message Given:  Yes    Elliot CousinShavis, Susann [place]horne Ellen, RN 12/18/2016, 12:50 PM

## 2016-12-18 NOTE — Progress Notes (Signed)
Subjective: 3 Days Post-Op Procedure(s) (LRB): LEFT TOTAL HIP ARTHROPLASTY ANTERIOR APPROACH (Left)  Patient reports pain as mild to moderate.  Patient has concerns about D/C to home as she feels unsteady and unsafe with ambulatory status.  Denies fever, chills, N/V.  Tolerating POs well.  Admits to BM  Objective:   VITALS:  Temp:  [98 F (36.7 C)-98.1 F (36.7 C)] 98 F (36.7 C) (03/10 0524) Pulse Rate:  [63-68] 66 (03/10 0524) Resp:  [16] 16 (03/10 0524) BP: (125-136)/(54-80) 130/80 (03/10 0627) SpO2:  [93 %-98 %] 93 % (03/10 0524)  General: WDWN patient in NAD. Psych:  Appropriate mood and affect. Neuro:  A&O x 3, Moving all extremities, sensation intact to light touch HEENT:  EOMs intact Chest:  Even non-labored respirations Skin:  Dressing C/D/I, no rashes or lesions Extremities: warm/dry, mild edema, no erythema or echymosis.  No lymphadenopathy. Pulses: Popliteus 2 + MSK:  ROM: HF 90 degrees, MMT: patient is able perform quad set, (-) Homan's    LABS  Recent Labs  12/16/16 0431 12/17/16 0430 12/18/16 0450  HGB 10.9* 10.9* 11.3*  WBC 10.4 15.6* 10.2  PLT 195 206 195    Recent Labs  12/16/16 0431 12/17/16 0430  NA 140 141  K 3.7 4.0  CL 106 107  CO2 26 29  BUN 12 14  CREATININE 0.97 0.92  GLUCOSE 153* 117*   No results for input(s): LABPT, INR in the last 72 hours.   Assessment/Plan: 3 Days Post-Op Procedure(s) (LRB): LEFT TOTAL HIP ARTHROPLASTY ANTERIOR APPROACH (Left)  Patient is a medicare bundle patient.  Patient and daughter have had extensive conversation with Dr. Lequita HaltAluisio about D/C planning.  Due to unsafe ambulatory status it is medically necessary for the patient to stay until ambulatory status improves.  If after working therapy today patient feels as though her status as improved the nurse will call me and I will place the D/C order. Up with therapy WBAT L LE Plan for eventual D/C home with [facility] Scripts on chart Plan for 2 week  outpatient post-op visit with Dr. Berdine AddisonAluisio  Daysi Boggan, PA-C, ATC [facility] Office:  [phone]

## 2016-12-18 NOTE — Care Management Note (Addendum)
Case Management Note  Patient Details  Name: Melissa HartJoan M Abdo MRN: [id number] Date of Birth: [birth year]  Subjective/Objective:    S/p L THR                Action/Plan: Discharge Planning:  NCM spoke to pt and children, Drinda Buttsnnette, and Onalee Huaavid. Son, Onalee HuaDavid will be at pt's house to assist her at home. Will provide a list of private duty agencies. Pt HH was arranged with [facility]. Dtr was considering [facility]. Dtr and Son, Onalee HuaDavid has concerns about pt being at home and requesting HHPT. NCM contacted attending to make aware of families concerns.   PCP Scherrie NovemberIMMONS, ASHLEY MD    [phone] [facility]. Contacted [facility] with new referral.   Expected Discharge Date:  12/17/16               Expected Discharge Plan:  Home w Home Health Services  In-House Referral:  NA  Discharge planning Services  CM Consult  Post Acute Care Choice:  Home Health Choice offered to:  Adult Children  DME Arranged:  N/A DME Agency:  NA  HH Arranged:  PT HH Agency:  Other - See comment  Status of Service:  Completed, signed off  If discussed at Long Length of Stay Meetings, dates discussed:    Additional Comments:  Elliot CousinShavis, Unity Luepke Ellen, RN 12/18/2016, 1:03 PM

## 2016-12-18 NOTE — Progress Notes (Signed)
Physical Therapy Treatment Patient Details Name: Melissa Harris MRN: [id number] DOB: 08/09/1928 Today's Date: 12/18/2016    History of Present Illness Pt s/p L THR and with hx of bil TKR and R THR    PT Comments    Pt continues to progress with mobility despite report of increased pain this am.  RN aware   Follow Up Recommendations  Home health PT     Equipment Recommendations  None recommended by PT    Recommendations for Other Services OT consult     Precautions / Restrictions Precautions Precautions: Fall Restrictions Weight Bearing Restrictions: No Other Position/Activity Restrictions: WBAT    Mobility  Bed Mobility Overal bed mobility: Needs Assistance Bed Mobility: Sit to Supine       Sit to supine: Supervision   General bed mobility comments: Pt transferred to bed sans assist and sans cues  Transfers Overall transfer level: Needs assistance Equipment used: Rolling walker (2 wheeled) Transfers: Sit to/from Stand Sit to Stand: Supervision Stand pivot transfers: Supervision          Ambulation/Gait Ambulation/Gait assistance: Min guard;Supervision Ambulation Distance (Feet): 200 Feet Assistive device: Rolling walker (2 wheeled) Gait Pattern/deviations: Step-through pattern;Decreased step length - right;Decreased step length - left;Shuffle;Trunk flexed Gait velocity: decr Gait velocity interpretation: Below normal speed for age/gender General Gait Details: cues for posture, position from RW, and basic safety awareness    Stairs Stairs: Yes   Stair Management: No rails;Step to pattern;Forwards;With walker Number of Stairs: 2 General stair comments: single step twice with RW, cues for sequence and min assist to manage RW and steady  Wheelchair Mobility    Modified Rankin (Stroke Patients Only)       Balance Overall balance assessment: Needs assistance   Sitting balance-Leahy Scale: Good       Standing balance-Leahy Scale: Fair                       Cognition Arousal/Alertness: Awake/alert Behavior During Therapy: WFL for tasks assessed/performed Overall Cognitive Status: Within Functional Limits for tasks assessed                 General Comments: Pt impulsive and requiring redirection to focus on task    Exercises Total Joint Exercises Ankle Circles/Pumps: AROM;Both;15 reps;Supine Quad Sets: AROM;Both;10 reps;Supine Heel Slides: AAROM;Left;Supine;15 reps Hip ABduction/ADduction: AAROM;Left;15 reps;Supine    General Comments        Pertinent Vitals/Pain Pain Assessment: 0-10 Pain Score: 6  Faces Pain Scale: Hurts a little bit Pain Location: L hip Pain Descriptors / Indicators: Aching;Sore Pain Intervention(s): Limited activity within patient's tolerance;Monitored during session;Premedicated before session;Ice applied    Home Living                      Prior Function            PT Goals (current goals can now be found in the care plan section) Acute Rehab PT Goals Patient Stated Goal: be independent again PT Goal Formulation: With patient Time For Goal Achievement: 12/22/16 Potential to Achieve Goals: Good Progress towards PT goals: Progressing toward goals    Frequency    7X/week      PT Plan Discharge plan needs to be updated    Co-evaluation             End of Session Equipment Utilized During Treatment: Gait belt Activity Tolerance: Patient tolerated treatment well Patient left: in bed;with call bell/phone within reach;with bed alarm  set Nurse Communication: Mobility status;Patient requests pain meds PT Visit Diagnosis: Difficulty in walking, not elsewhere classified (R26.2)     Time: [id number] PT Time Calculation (min) (ACUTE ONLY): 27 min  Charges:  $Gait Training: 8-22 mins $Therapeutic Exercise: 8-22 mins                    G Codes:       Kynnadi Dicenso 12/18/2016, 10:42 AM

## 2016-12-18 NOTE — Progress Notes (Signed)
Occupational Therapy Treatment Patient Details Name: Melissa Harris MRN: [id number] DOB: 10-13-1927 Today's Date: 12/18/2016    History of present illness Pt s/p L THR and with hx of bil TKR and R THR   OT comments  Making excellent progress. Completed education with pt/son regarding shower transfer, home safety and reducing risk of falls. Feel pt safe to DC home with initial 24/7 S when medically stable. Will continue to follow acutely to maximize safety and functional independence with safe DC home.   Follow Up Recommendations  Supervision/Assistance - 24 hour    Equipment Recommendations    none   Recommendations for Other Services      Precautions / Restrictions Precautions Precautions: Fall       Mobility Bed Mobility Overal bed mobility: Modified Independent                Transfers Overall transfer level: Needs assistance Equipment used: Rolling walker (2 wheeled) Transfers: Sit to/from [employer] Sit to Stand: Supervision Stand pivot transfers: Supervision            Balance Overall balance assessment: Needs assistance   Sitting balance-Leahy Scale: Good       Standing balance-Leahy Scale: Fair                     ADL Overall ADL's : Needs assistance/impaired         Upper Body Bathing: Set up   Lower Body Bathing: Supervison/ safety;Sit to/from stand   Upper Body Dressing : Set up   Lower Body Dressing: Minimal assistance;Sit to/from stand           Tub/ Shower Transfer: Supervision/safety;Cueing for sequencing;Cueing for safety;Ambulation;3 in 1 Tub/Shower Transfer Details (indicate cue type and reason): Educated pt/son on shower transfer techniques. Pt/son able to return demonstrate Functional mobility during ADLs: Supervision/safety;Rolling walker General ADL Comments: Educated pt/son on reducing risk of falls at home. discussed modifying home to maximize independence and reduce risk of falls. Recommended  to son to install grab bar at entrance to shower. Pt/son verbalized understnaidng.       Vision    glasses                 Perception     Praxis      Cognition   Behavior During Therapy: WFL for tasks assessed/performed Overall Cognitive Status: Within Functional Limits for tasks assessed                         Exercises     Shoulder Instructions       General Comments      Pertinent Vitals/ Pain       Pain Assessment: Faces Faces Pain Scale: Hurts a little bit Pain Location: L hip Pain Descriptors / Indicators: Aching;Sore Pain Intervention(s): Limited activity within patient's tolerance  Home Living                                          Prior Functioning/Environment              Frequency  Min 2X/week        Progress Toward Goals  OT Goals(current goals can now be found in the care plan section)  Progress towards OT goals: Progressing toward goals  Acute Rehab OT Goals Patient Stated Goal: be independent again OT  Goal Formulation: With patient Time For Goal Achievement: 12/30/16 Potential to Achieve Goals: Good ADL Goals Pt Will Perform Grooming: with supervision;standing Pt Will Perform Lower Body Bathing: with supervision;sit to/from stand Pt Will Perform Lower Body Dressing: with supervision;sit to/from stand Pt Will Perform Toileting - Clothing Manipulation and hygiene: with supervision;sit to/from stand Pt Will Perform Tub/Shower Transfer: with supervision;shower seat;rolling walker  Plan Discharge plan remains appropriate    Co-evaluation                 End of Session Equipment Utilized During Treatment: Rolling walker;Gait belt  OT Visit Diagnosis: Unsteadiness on feet (R26.81);Pain;Muscle weakness (generalized) (M62.81) Pain - Right/Left: Left Pain - part of body: Hip   Activity Tolerance Patient tolerated treatment well   Patient Left in chair;with call bell/phone within reach;with  bed alarm set;with family/visitor present   Nurse Communication Mobility status        Time: [id number] OT Time Calculation (min): 21 min  Charges: OT General Charges $OT Visit: 1 Procedure OT Treatments $Self Care/Home Management : 8-22 mins  Melissa Harris, OT/L  [phone] 12/18/2016   Melissa Harris,Melissa Harris 12/18/2016, 10:27 AM

## 2016-12-18 NOTE — Progress Notes (Signed)
I received call from nurse and nurse case manager that patient is ready to go home with family today.  They would like home health PT.  I will put in an order for face to face and D/C promptly.    Melissa MartinezJustin Adilene Areola, PA-C [facility] Office:  [phone]

## 2017-01-06 ENCOUNTER — Ambulatory Visit: Payer: Self-pay | Admitting: Orthopedic Surgery

## 2017-11-24 ENCOUNTER — Encounter (HOSPITAL_BASED_OUTPATIENT_CLINIC_OR_DEPARTMENT_OTHER): Payer: Self-pay | Admitting: Emergency Medicine

## 2017-11-24 ENCOUNTER — Other Ambulatory Visit: Payer: Self-pay

## 2017-11-24 ENCOUNTER — Emergency Department (HOSPITAL_BASED_OUTPATIENT_CLINIC_OR_DEPARTMENT_OTHER)
Admission: EM | Admit: 2017-11-24 | Discharge: 2017-11-24 | Disposition: A | Discharge status: Home / Self Care | Payer: Medicare Other | Attending: Emergency Medicine | Admitting: Emergency Medicine

## 2017-11-24 DIAGNOSIS — R1013 Epigastric pain: Secondary | ICD-10-CM

## 2017-11-24 DIAGNOSIS — Z96653 Presence of artificial knee joint, bilateral: Secondary | ICD-10-CM | POA: Insufficient documentation

## 2017-11-24 DIAGNOSIS — Z96643 Presence of artificial hip joint, bilateral: Secondary | ICD-10-CM | POA: Diagnosis not present

## 2017-11-24 DIAGNOSIS — Z7901 Long term (current) use of anticoagulants: Secondary | ICD-10-CM | POA: Diagnosis not present

## 2017-11-24 DIAGNOSIS — I10 Essential (primary) hypertension: Secondary | ICD-10-CM | POA: Diagnosis not present

## 2017-11-24 LAB — LIPASE, BLOOD: LIPASE: 53 U/L — AB (ref 11–51)

## 2017-11-24 LAB — CBC WITH DIFFERENTIAL/PLATELET
BASOS ABS: 0 10*3/uL (ref 0.0–0.1)
Basophils Relative: 1 %
EOS PCT: 2 %
Eosinophils Absolute: 0.2 10*3/uL (ref 0.0–0.7)
HEMATOCRIT: 43.6 % (ref 36.0–46.0)
Hemoglobin: 14.7 g/dL (ref 12.0–15.0)
LYMPHS ABS: 1.1 10*3/uL (ref 0.7–4.0)
LYMPHS PCT: 14 %
MCH: 31.2 pg (ref 26.0–34.0)
MCHC: 33.7 g/dL (ref 30.0–36.0)
MCV: 92.6 fL (ref 78.0–100.0)
MONOS PCT: 8 %
Monocytes Absolute: 0.6 10*3/uL (ref 0.1–1.0)
NEUTROS ABS: 5.7 10*3/uL (ref 1.7–7.7)
Neutrophils Relative %: 75 %
Platelets: 245 10*3/uL (ref 150–400)
RBC: 4.71 MIL/uL (ref 3.87–5.11)
RDW: 12.4 % (ref 11.5–15.5)
WBC: 7.6 10*3/uL (ref 4.0–10.5)

## 2017-11-24 LAB — COMPREHENSIVE METABOLIC PANEL WITH GFR
ALT: 11 U/L — ABNORMAL LOW (ref 14–54)
AST: 16 U/L (ref 15–41)
Albumin: 3.9 g/dL (ref 3.5–5.0)
Alkaline Phosphatase: 85 U/L (ref 38–126)
Anion gap: 10 (ref 5–15)
BUN: 14 mg/dL (ref 6–20)
CO2: 28 mmol/L (ref 22–32)
Calcium: 9.5 mg/dL (ref 8.9–10.3)
Chloride: 101 mmol/L (ref 101–111)
Creatinine, Ser: 0.93 mg/dL (ref 0.44–1.00)
GFR calc Af Amer: >60 mL/min
GFR calc non Af Amer: 53 mL/min — ABNORMAL LOW
Glucose, Bld: 110 mg/dL — ABNORMAL HIGH (ref 65–99)
Potassium: 3.8 mmol/L (ref 3.5–5.1)
Sodium: 139 mmol/L (ref 135–145)
Total Bilirubin: 0.9 mg/dL (ref 0.3–1.2)
Total Protein: 7.8 g/dL (ref 6.5–8.1)

## 2017-11-24 MED ORDER — SUCRALFATE 1 G PO TABS
1.0000 g | ORAL_TABLET | Freq: Once | ORAL | Status: AC
  Administered 2017-11-24: 1 g via ORAL
  Filled 2017-11-24: qty 1

## 2017-11-24 MED ORDER — GI COCKTAIL ~~LOC~~
30.0000 mL | Freq: Once | ORAL | Status: AC
  Administered 2017-11-24: 30 mL via ORAL
  Filled 2017-11-24: qty 30

## 2017-11-24 MED ORDER — OMEPRAZOLE 40 MG PO CPDR: 40.0000 mg | DELAYED_RELEASE_CAPSULE | Freq: Every day | ORAL | 0 refills | Status: DC

## 2017-11-24 MED ORDER — SIMETHICONE 80 MG PO CHEW: 80.0000 mg | CHEWABLE_TABLET | Freq: Four times a day (QID) | ORAL | 0 refills | Status: AC | PRN

## 2017-11-24 MED FILL — OMEPRAZOLE DR 40 MG CAPSULE: 40 | 30 days supply | Qty: 30 | Fill #0

## 2017-11-24 MED FILL — MI-ACID GAS 80 MG TAB CHEW: 80 | 25 days supply | Qty: 100 | Fill #0

## 2017-11-24 NOTE — ED Provider Notes (Signed)
[facility] EMERGENCY DEPARTMENT Provider Note   CSN: [id number] Arrival date & time: 11/24/17  7829     History   Chief Complaint Chief Complaint  Patient presents with  . Abdominal Pain    HPI Melissa Harris is a 84 y.o. female with history of HTN, GERD, and OA who presents for abdominal pain x 2 days.  HPI   Patient has had upper abdominal pain since eating dinner Tuesday night. She has had very little by mouth since yesterday aside from sips of water due to concern for worsening pain. She has history of GERD with gastric ulcer seen on upper endoscopy in 2017 but only takes tums. She "sometimes" takes prilosec. Her son is present and says she takes a lot of her medication "as needed" whereas has been prescribed to take regularly. Patient is concerned because pain is fairly constant and wonders if she could have cancer or another ulcer. She denies dark BMs. Says she has a BM most days but sometimes strains. She had a BM this morning and 2 yesterday. No vomiting or nausea. No fevers. Reports ongoing upper back pain. Has had a lot of burping. Gallbladder and appendix surgically absent. She takes aleve most mornings per son, but she denies this and says she will take aspirin from time to time for arthritis pain.  She had elevated lipase to 395 in October 2018, but pancreas appeared normal on CT abdomen; antral gastritis thought to be cause of epigastric pain at that time.   Past Medical History:  Diagnosis Date  . Carpal tunnel syndrome    left   . Depression   . GERD (gastroesophageal reflux disease)   . Hypertension   . Nocturia   . Osteoarthritis     Patient Active Problem List   Diagnosis Date Noted  . OA (osteoarthritis) of hip 12/15/2016  . OA (osteoarthritis) of knee 12/20/2014  . Preop cardiovascular exam 11/28/2014  . Palpitations 11/28/2014    Past Surgical History:  Procedure Laterality Date  . APPENDECTOMY    . CATARACT EXTRACTION    .  CHOLECYSTECTOMY    . TONSILLECTOMY AND ADENOIDECTOMY    . TOTAL HIP ARTHROPLASTY Right   . TOTAL HIP ARTHROPLASTY Left 12/15/2016   Procedure: LEFT TOTAL HIP ARTHROPLASTY ANTERIOR APPROACH;  Surgeon: Ollen Gross, MD;  Location: [facility];  Service: Orthopedics;  Laterality: Left;  requests  . TOTAL KNEE ARTHROPLASTY Right   . TOTAL KNEE ARTHROPLASTY Left 12/20/2014   Procedure: LEFT TOTAL KNEE ARTHROPLASTY;  Surgeon: Ollen Gross, MD;  Location: [facility];  Service: Orthopedics;  Laterality: Left;    OB History    No data available       Home Medications    Prior to Admission medications   Medication Sig Start Date End Date Taking? Authorizing Provider  acetaminophen (TYLENOL) 325 MG tablet Take 2 tablets (650 mg total) by mouth every 6 (six) hours as needed for mild pain (or Fever >/= 101). Patient taking differently: Take 325 mg by mouth every 6 (six) hours as needed for mild pain (or Fever >/= 101).  12/23/14   Perkins, Alexzandrew L, PA-C  bisacodyl (DULCOLAX) 10 MG suppository Place 1 suppository (10 mg total) rectally daily as needed for moderate constipation. Patient not taking: Reported on 12/03/2016 12/23/14   Julien Girt, Alexzandrew L, PA-C  docusate sodium (COLACE) 100 MG capsule Take 1 capsule (100 mg total) by mouth 2 (two) times daily. Patient not taking: Reported on 12/03/2016 12/23/14  Perkins, Alexzandrew L, PA-C  latanoprost (XALATAN) 0.005 % ophthalmic solution Place 1 drop into both eyes at bedtime.     [provider]  methocarbamol (ROBAXIN) 500 MG tablet Take 1 tablet (500 mg total) by mouth every 6 (six) hours as needed for muscle spasms. 12/17/16   Perkins, Alexzandrew L, PA-C  metoprolol tartrate (LOPRESSOR) 25 MG tablet Take 25-50 mg by mouth See admin instructions. 1 in the morning and two in the evening     [provider]  omeprazole (PRILOSEC) 40 MG capsule Take 40 mg by mouth daily as needed (heartburn).    [provider]  omeprazole  (PRILOSEC) 40 MG capsule Take 1 capsule (40 mg total) by mouth daily. 11/24/17   Casey BurkittFitzgerald, [name] Moen, MD  rivaroxaban (XARELTO) 10 MG TABS tablet Take 1 tablet (10 mg total) by mouth daily with breakfast. Take Xarelto for two and a half more weeks following discharge from the hospital, then discontinue Xarelto. Once the patient has completed the blood thinner regimen, then take a Baby 81 mg Aspirin daily for three more weeks. 12/18/16   Perkins, Alexzandrew L, PA-C  simethicone (MYLICON) 80 MG chewable tablet Chew 1 tablet (80 mg total) by mouth every 6 (six) hours as needed for flatulence. 11/24/17   Casey BurkittFitzgerald, [name] Moen, MD  timolol (BETIMOL) 0.5 % ophthalmic solution Place 1 drop into the right eye daily.    [provider]  traMADol (ULTRAM) 50 MG tablet Take 1-2 tablets (50-100 mg total) by mouth every 6 (six) hours as needed for moderate pain. 12/17/16   Perkins, Alexzandrew L, PA-C    Family History Family History  Problem Relation Age of Onset  . Dementia Unknown        Died age 84    Social History Social History   Tobacco Use  . Smoking status: Never Smoker  . Smokeless tobacco: Never Used  Substance Use Topics  . Alcohol use: No    Alcohol/week: 0.0 oz  . Drug use: No     Allergies   Patient has no known allergies.   Review of Systems Review of Systems  Constitutional: Positive for appetite change. Negative for activity change, fatigue and fever.  Respiratory: Negative for shortness of breath.   Cardiovascular: Negative for chest pain.  Gastrointestinal: Positive for abdominal pain. Negative for blood in stool, diarrhea, nausea and vomiting.  Genitourinary: Negative for dysuria and urgency.  Musculoskeletal: Positive for back pain. Negative for myalgias.  Neurological: Negative for weakness.     Physical Exam Updated Vital Signs BP (!) 137/92   Pulse 64   Temp 98.6 F (37 C)   Resp (!) 21   SpO2 97%   Physical Exam  Constitutional: She  appears well-developed and well-nourished.  Non-toxic appearance. No distress.  HENT:  Head: Normocephalic and atraumatic.  Mouth/Throat: Oropharynx is clear and moist.  Eyes: EOM are normal.  Cardiovascular: Normal rate, regular rhythm and normal heart sounds.  No murmur heard. Pulmonary/Chest: Effort normal and breath sounds normal. She has no wheezes.  Abdominal: Soft. Normal appearance and bowel sounds are normal. There is no CVA tenderness. No hernia.  Tender over upper abdomen, especially epigastric region with some guarding. However, exam varies with distraction. Somewhat hyperresonant to percussion.   Skin: Skin is warm and dry. She is not diaphoretic.  Nursing note and vitals reviewed. MSK: Upper thoracic TTP over midline spine.    ED Treatments / Results  Labs (all labs ordered are listed, but only abnormal  results are displayed) Labs Reviewed  COMPREHENSIVE METABOLIC PANEL - Abnormal; Notable for the following components:      Result Value   Glucose, Bld 110 (*)    ALT 11 (*)    GFR calc non Af Amer 53 (*)    All other components within normal limits  LIPASE, BLOOD - Abnormal; Notable for the following components:   Lipase 53 (*)    All other components within normal limits  CBC WITH DIFFERENTIAL/PLATELET    EKG  EKG Interpretation None       Radiology No results found.  Procedures Procedures (including critical care time)  Medications Ordered in ED Medications  sucralfate (CARAFATE) tablet 1 g (1 g Oral Given 11/24/17 1108)  gi cocktail (Maalox,Lidocaine,Donnatal) (30 mLs Oral Given 11/24/17 1207)     Initial Impression / Assessment and Plan / ED Course  I have reviewed the triage vital signs and the nursing notes.  Pertinent labs & imaging results that were available during my care of the patient were reviewed by me and considered in my medical decision making (see chart for details).  Patient tolerated liquid challenge after GI cocktail. Did not  have improvement from carafate.   Normal LFTs and SCr on CMP. Lipase 53.   Final Clinical Impressions(s) / ED Diagnoses   Final diagnoses:  Epigastric pain   Patient with epigastric pain and history of stomach ulcer and gastritis. She is afebrile with stable vital signs and tolerating PO upon discharge. Considered pancreatitis (lipase normal). No fever or lower abdominal pain to suggest diverticulitis. Recommended follow-up with [facility], where she has had imaging previously. Recommended restarting prilosec 40 mg daily until seen by GI. Can try simethicone for gas pains.   ED Discharge Orders        Ordered    omeprazole (PRILOSEC) 40 MG capsule  Daily     11/24/17 1250    simethicone (MYLICON) 80 MG chewable tablet  Every 6 hours PRN     11/24/17 1250     Dani Gobble, MD [facility], PGY-3    Casey Burkitt, MD 11/24/17 1726    Little, Ambrose Finland, MD 11/26/17 [phone]

## 2017-11-24 NOTE — Discharge Instructions (Addendum)
Ms. Melissa Harris,  Your labwork today was reassuring without elevated kidney function, liver function or pancreas activity. Please take prilosec 40 mg daily until follow-up with Gastroenterology. [facility] Vira BlancoIngrid Gonzalez. Please call [phone] for appointment to continue workup of your upper abdominal pain. If you are unable to keep any liquids down or pain becomes severe, please seek medical evaluation.

## 2017-11-24 NOTE — ED Triage Notes (Signed)
Patient has had intermittent epigastric pain after eating a tuna salad sandwich x 2 days.

## 2017-11-24 NOTE — ED Notes (Signed)
Patient is walking out to the waiting room and wants to leave. Patient's d/c instructions reviewed at desk per patients request

## 2018-04-28 ENCOUNTER — Other Ambulatory Visit: Payer: Self-pay

## 2018-04-28 ENCOUNTER — Emergency Department (HOSPITAL_BASED_OUTPATIENT_CLINIC_OR_DEPARTMENT_OTHER): Payer: Medicare Other

## 2018-04-28 ENCOUNTER — Encounter (HOSPITAL_BASED_OUTPATIENT_CLINIC_OR_DEPARTMENT_OTHER): Payer: Self-pay | Admitting: Emergency Medicine

## 2018-04-28 ENCOUNTER — Emergency Department (HOSPITAL_BASED_OUTPATIENT_CLINIC_OR_DEPARTMENT_OTHER)
Admission: EM | Admit: 2018-04-28 | Discharge: 2018-04-28 | Disposition: A | Discharge status: Skilled Nursing Facility | Payer: Medicare Other | Attending: Emergency Medicine | Admitting: Emergency Medicine

## 2018-04-28 DIAGNOSIS — M25551 Pain in right hip: Secondary | ICD-10-CM | POA: Diagnosis not present

## 2018-04-28 DIAGNOSIS — I10 Essential (primary) hypertension: Secondary | ICD-10-CM | POA: Diagnosis not present

## 2018-04-28 DIAGNOSIS — Z79899 Other long term (current) drug therapy: Secondary | ICD-10-CM | POA: Insufficient documentation

## 2018-04-28 DIAGNOSIS — Z96643 Presence of artificial hip joint, bilateral: Secondary | ICD-10-CM | POA: Insufficient documentation

## 2018-04-28 DIAGNOSIS — Z96653 Presence of artificial knee joint, bilateral: Secondary | ICD-10-CM | POA: Diagnosis not present

## 2018-04-28 MED ORDER — CALCIUM CARBONATE ANTACID 500 MG PO CHEW
1.0000 | CHEWABLE_TABLET | Freq: Once | ORAL | Status: AC
  Administered 2018-04-28: 200 mg via ORAL
  Filled 2018-04-28: qty 1

## 2018-04-28 MED ORDER — OXYCODONE HCL 5 MG PO TABS
2.5000 mg | ORAL_TABLET | Freq: Once | ORAL | Status: AC
  Administered 2018-04-28: 2.5 mg via ORAL
  Filled 2018-04-28: qty 1

## 2018-04-28 MED ORDER — ACETAMINOPHEN 500 MG PO TABS
1000.0000 mg | ORAL_TABLET | Freq: Once | ORAL | Status: AC
  Administered 2018-04-28: 1000 mg via ORAL
  Filled 2018-04-28: qty 2

## 2018-04-28 NOTE — ED Triage Notes (Signed)
Pt fell at home while bowling at the [employer].  Pt fell onto cement.  Pt c/o right hip pain.

## 2018-04-28 NOTE — ED Provider Notes (Signed)
[facility] EMERGENCY DEPARTMENT Provider Note   CSN: [id number] Arrival date & time: 04/28/18  1618     History   Chief Complaint Chief Complaint  Patient presents with  . Fall    HPI Melissa Harris is a 84 y.o. female.  84 yo F with a chief complaint of a fall.  The patient was at her nursing home and was doing an event where they were outside bowling.  She lost her balance and fell onto her right side.  Landed on the lateral aspect of the right upper part of her leg.  She denies head injury denies loss consciousness denies neck pain back pain chest pain.  When asked if she had abdominal pain she stated that she has chronic abdominal pain that is related to reflux.  She denies worsening during the fall.  Nuys upper extremity pain.  The history is provided by the patient.  Fall  This is a new problem. The current episode started 1 to 2 hours ago. The problem occurs constantly. The problem has not changed since onset.Pertinent negatives include no chest pain, no abdominal pain, no headaches and no shortness of breath. Nothing aggravates the symptoms. Nothing relieves the symptoms. She has tried nothing for the symptoms. The treatment provided no relief.    Past Medical History:  Diagnosis Date  . Carpal tunnel syndrome    left   . Depression   . GERD (gastroesophageal reflux disease)   . Hypertension   . Nocturia   . Osteoarthritis     Patient Active Problem List   Diagnosis Date Noted  . OA (osteoarthritis) of hip 12/15/2016  . OA (osteoarthritis) of knee 12/20/2014  . Preop cardiovascular exam 11/28/2014  . Palpitations 11/28/2014    Past Surgical History:  Procedure Laterality Date  . APPENDECTOMY    . CATARACT EXTRACTION    . CHOLECYSTECTOMY    . TONSILLECTOMY AND ADENOIDECTOMY    . TOTAL HIP ARTHROPLASTY Right   . TOTAL HIP ARTHROPLASTY Left 12/15/2016   Procedure: LEFT TOTAL HIP ARTHROPLASTY ANTERIOR APPROACH;  Surgeon: Ollen GrossFrank Aluisio, MD;  Location:  [facility];  Service: Orthopedics;  Laterality: Left;  requests 105mins  . TOTAL KNEE ARTHROPLASTY Right   . TOTAL KNEE ARTHROPLASTY Left 12/20/2014   Procedure: LEFT TOTAL KNEE ARTHROPLASTY;  Surgeon: Ollen GrossFrank Aluisio, MD;  Location: [facility];  Service: Orthopedics;  Laterality: Left;     OB History   None      Home Medications    Prior to Admission medications   Medication Sig Start Date End Date Taking? Authorizing Provider  latanoprost (XALATAN) 0.005 % ophthalmic solution Place 1 drop into both eyes at bedtime.    Yes [provider]  metoprolol tartrate (LOPRESSOR) 25 MG tablet Take 25-50 mg by mouth See admin instructions. 1 in the morning and two in the evening    Yes [provider]  timolol (BETIMOL) 0.5 % ophthalmic solution Place 1 drop into the right eye daily.   Yes [provider]  acetaminophen (TYLENOL) 325 MG tablet Take 2 tablets (650 mg total) by mouth every 6 (six) hours as needed for mild pain (or Fever >/= 101). Patient taking differently: Take 325 mg by mouth every 6 (six) hours as needed for mild pain (or Fever >/= 101).  12/23/14   Perkins, Alexzandrew L, PA-C  bisacodyl (DULCOLAX) 10 MG suppository Place 1 suppository (10 mg total) rectally daily as needed for moderate constipation. Patient not taking: Reported on 12/03/2016 12/23/14  Perkins, Alexzandrew L, PA-C  docusate sodium (COLACE) 100 MG capsule Take 1 capsule (100 mg total) by mouth 2 (two) times daily. Patient not taking: Reported on 12/03/2016 12/23/14   Julien Girt, Alexzandrew L, PA-C  methocarbamol (ROBAXIN) 500 MG tablet Take 1 tablet (500 mg total) by mouth every 6 (six) hours as needed for muscle spasms. Patient not taking: Reported on 04/28/2018 12/17/16   Julien Girt, Alexzandrew L, PA-C  omeprazole (PRILOSEC) 40 MG capsule Take 1 capsule (40 mg total) by mouth daily. Patient not taking: Reported on 04/28/2018 11/24/17   Casey Burkitt, MD  rivaroxaban (XARELTO) 10 MG TABS tablet  Take 1 tablet (10 mg total) by mouth daily with breakfast. Take Xarelto for two and a half more weeks following discharge from the hospital, then discontinue Xarelto. Once the patient has completed the blood thinner regimen, then take a Baby 81 mg Aspirin daily for three more weeks. Patient not taking: Reported on 04/28/2018 12/18/16   Julien Girt, Alexzandrew L, PA-C  simethicone (MYLICON) 80 MG chewable tablet Chew 1 tablet (80 mg total) by mouth every 6 (six) hours as needed for flatulence. Patient not taking: Reported on 04/28/2018 11/24/17   Casey Burkitt, MD  traMADol (ULTRAM) 50 MG tablet Take 1-2 tablets (50-100 mg total) by mouth every 6 (six) hours as needed for moderate pain. Patient not taking: Reported on 04/28/2018 12/17/16   Lauraine Rinne, PA-C    Family History Family History  Problem Relation Age of Onset  . Dementia Unknown        Died age 56    Social History Social History   Tobacco Use  . Smoking status: Never Smoker  . Smokeless tobacco: Never Used  Substance Use Topics  . Alcohol use: No    Alcohol/week: 0.0 oz  . Drug use: No     Allergies   Patient has no known allergies.   Review of Systems Review of Systems  Constitutional: Negative for chills and fever.  HENT: Negative for congestion and rhinorrhea.   Eyes: Negative for redness and visual disturbance.  Respiratory: Negative for shortness of breath and wheezing.   Cardiovascular: Negative for chest pain and palpitations.  Gastrointestinal: Negative for abdominal pain, nausea and vomiting.  Genitourinary: Negative for dysuria and urgency.  Musculoskeletal: Positive for arthralgias. Negative for myalgias.  Skin: Negative for pallor and wound.  Neurological: Negative for dizziness and headaches.     Physical Exam Updated Vital Signs BP (!) 161/76 (BP Location: Right Arm)   Pulse 78   Resp 18   Ht 5\' 5"  (1.651 m)   Wt 76.2 kg (168 lb)   SpO2 100%   BMI 27.96 kg/m   Physical  Exam  Constitutional: She is oriented to person, place, and time. She appears well-developed and well-nourished. No distress.  HENT:  Head: Normocephalic and atraumatic.  Eyes: Pupils are equal, round, and reactive to light. EOM are normal.  Neck: Normal range of motion. Neck supple.  Cardiovascular: Normal rate and regular rhythm. Exam reveals no gallop and no friction rub.  No murmur heard. Pulmonary/Chest: Effort normal. She has no wheezes. She has no rales.  Abdominal: Soft. She exhibits no distension. There is no tenderness.  Musculoskeletal: She exhibits no edema or tenderness.  Tenderness to the lateral aspect of the right hip, about the trochanter.  PMS intact distally.   Neurological: She is alert and oriented to person, place, and time.  Skin: Skin is warm and dry. She is not diaphoretic.  Psychiatric:  She has a normal mood and affect. Her behavior is normal.  Nursing note and vitals reviewed.    ED Treatments / Results  Labs (all labs ordered are listed, but only abnormal results are displayed) Labs Reviewed - No data to display  EKG None  Radiology Dg Hip Unilat W Or Wo Pelvis 2-3 Views Right  Result Date: 04/28/2018 CLINICAL DATA:  Pain following fall EXAM: DG HIP (WITH OR WITHOUT PELVIS) 2-3V RIGHT COMPARISON:  December 15, 2016 FINDINGS: Frontal pelvis as well as frontal and lateral right hip images were obtained. There are total hip replacements bilaterally with prosthetic components well-seated. No fracture or dislocation. No erosive changes. IMPRESSION: S/p total hip replacements with prosthetic components bilaterally well-seated. No acute fracture or dislocation. Electronically Signed   By: Bretta Bang III M.D.   On: 04/28/2018 17:09    Procedures Procedures (including critical care time)  Medications Ordered in ED Medications  acetaminophen (TYLENOL) tablet 1,000 mg (1,000 mg Oral Given 04/28/18 1640)  oxyCODONE (Oxy IR/ROXICODONE) immediate release tablet  2.5 mg (2.5 mg Oral Given 04/28/18 1640)  calcium carbonate (TUMS - dosed in mg elemental calcium) chewable tablet 200 mg of elemental calcium (200 mg of elemental calcium Oral Given 04/28/18 1640)     Initial Impression / Assessment and Plan / ED Course  I have reviewed the triage vital signs and the nursing notes.  Pertinent labs & imaging results that were available during my care of the patient were reviewed by me and considered in my medical decision making (see chart for details).     84 yo F with a cc of a mechanical fall.  Happened earlier today.  Having pain to the right side of her leg.  No pain with internal and external rotation, pain with palpation to the pelvic rim and to the right lateral leg.  Will obtain plain film.  Treat pain.  Patient asking for tums as her chronic reflux has worsening.   Plain film negative as viewed by me.    5:39 PM:  I have discussed the diagnosis/risks/treatment options with the patient and family and believe the pt to be eligible for discharge home to follow-up with PCP. We also discussed returning to the ED immediately if new or worsening sx occur. We discussed the sx which are most concerning (e.g., sudden worsening pain, fever, inability to tolerate by mouth) that necessitate immediate return. Medications administered to the patient during their visit and any new prescriptions provided to the patient are listed below.  Medications given during this visit Medications  acetaminophen (TYLENOL) tablet 1,000 mg (1,000 mg Oral Given 04/28/18 1640)  oxyCODONE (Oxy IR/ROXICODONE) immediate release tablet 2.5 mg (2.5 mg Oral Given 04/28/18 1640)  calcium carbonate (TUMS - dosed in mg elemental calcium) chewable tablet 200 mg of elemental calcium (200 mg of elemental calcium Oral Given 04/28/18 1640)      The patient appears reasonably screen and/or stabilized for discharge and I doubt any other medical condition or other [facility] requiring further screening,  evaluation, or treatment in the ED at this time prior to discharge.    Final Clinical Impressions(s) / ED Diagnoses   Final diagnoses:  Right hip pain    ED Discharge Orders    None       Melene Plan, DO 04/28/18 1739

## 2018-04-28 NOTE — ED Notes (Signed)
Patient transported to X-ray 

## 2018-04-28 NOTE — Discharge Instructions (Signed)
Take tylenol 1000mg(2 extra strength) four times a day.  ° °

## 2018-04-28 NOTE — ED Notes (Signed)
Pt verbalizes understanding of d/c instructions and denies any further needs at this time. 

## 2020-01-07 ENCOUNTER — Encounter (HOSPITAL_COMMUNITY): Payer: Self-pay | Admitting: Emergency Medicine

## 2020-01-07 ENCOUNTER — Emergency Department (HOSPITAL_COMMUNITY): Payer: Medicare Other

## 2020-01-07 ENCOUNTER — Other Ambulatory Visit: Payer: Self-pay

## 2020-01-07 ENCOUNTER — Emergency Department (HOSPITAL_COMMUNITY)
Admission: EM | Admit: 2020-01-07 | Discharge: 2020-01-07 | Disposition: A | Discharge status: Home / Self Care | Payer: Medicare Other | Attending: Emergency Medicine | Admitting: Emergency Medicine

## 2020-01-07 DIAGNOSIS — K209 Esophagitis, unspecified without bleeding: Secondary | ICD-10-CM | POA: Diagnosis not present

## 2020-01-07 DIAGNOSIS — K224 Dyskinesia of esophagus: Secondary | ICD-10-CM | POA: Diagnosis not present

## 2020-01-07 DIAGNOSIS — M79606 Pain in leg, unspecified: Secondary | ICD-10-CM | POA: Diagnosis present

## 2020-01-07 DIAGNOSIS — I10 Essential (primary) hypertension: Secondary | ICD-10-CM | POA: Insufficient documentation

## 2020-01-07 DIAGNOSIS — Z79899 Other long term (current) drug therapy: Secondary | ICD-10-CM | POA: Diagnosis not present

## 2020-01-07 MED ORDER — OMEPRAZOLE 40 MG PO CPDR: 40.0000 mg | DELAYED_RELEASE_CAPSULE | Freq: Every day | ORAL | 1 refills | Status: DC

## 2020-01-07 MED ORDER — SUCRALFATE 1 G PO TABS: 1.0000 g | ORAL_TABLET | Freq: Three times a day (TID) | ORAL | 0 refills | Status: AC

## 2020-01-07 MED ORDER — ALUM & MAG HYDROXIDE-SIMETH 200-200-20 MG/5ML PO SUSP
30.0000 mL | Freq: Once | ORAL | Status: AC
  Administered 2020-01-07: 30 mL via ORAL
  Filled 2020-01-07: qty 30

## 2020-01-07 NOTE — Discharge Instructions (Signed)
We saw in the ER for throat discomfort. We suspect that you are having esophageal spasms.  Please take the medications prescribed.  We recommend that you follow-up with your primary care doctor or GI doctor for further care if the symptoms are not improving.

## 2020-01-07 NOTE — ED Provider Notes (Signed)
[facility]-EMERGENCY DEPT Provider Note   CSN: [id number] Arrival date & time: 01/07/20  1533     History Chief Complaint  Patient presents with  . Leg Pain    Melissa Harris is a 86 y.o. female.  HPI     86 year old female comes in a chief complaint of throat pain and discomfort. She has history of GERD, hypertension and esophageal spasms.  She reports that she has been having some throat discomfort for the last several weeks, however her symptoms have progressed recently.  She is having trouble swallowing and difficulty in breathing.  She denies any chest pain, abdominal pain.  She has seen GI doctors in the past for it and has had some surgical repair and she thinks she needs surgery again.  She resides in an independent living facility and does indicate that she had breakfast this morning but only partial lunch.  She has discomfort with any kind of swallowing.  She thinks that the symptoms are more related to her GI tract and not respiratory tract.  I also called patient's daughter.  She informed me that patient has complained of throat discomfort for a long time.  She indicates that patient had upper endoscopy 2 or 3 years ago which was normal.  She thereafter had a swallow evaluation last year which showed esophageal spasms.  They might have run out of the omeprazole she was taking.  She was given some medication that worked for both spasms and also anxiety, which she does not recall.  Past Medical History:  Diagnosis Date  . Carpal tunnel syndrome    left   . Depression   . GERD (gastroesophageal reflux disease)   . Hypertension   . Nocturia   . Osteoarthritis     Patient Active Problem List   Diagnosis Date Noted  . OA (osteoarthritis) of hip 12/15/2016  . OA (osteoarthritis) of knee 12/20/2014  . Preop cardiovascular exam 11/28/2014  . Palpitations 11/28/2014    Past Surgical History:  Procedure Laterality Date  . APPENDECTOMY    . CATARACT  EXTRACTION    . CHOLECYSTECTOMY    . TONSILLECTOMY AND ADENOIDECTOMY    . TOTAL HIP ARTHROPLASTY Right   . TOTAL HIP ARTHROPLASTY Left 12/15/2016   Procedure: LEFT TOTAL HIP ARTHROPLASTY ANTERIOR APPROACH;  Surgeon: Ollen Gross, MD;  Location: [facility];  Service: Orthopedics;  Laterality: Left;  requests  . TOTAL KNEE ARTHROPLASTY Right   . TOTAL KNEE ARTHROPLASTY Left 12/20/2014   Procedure: LEFT TOTAL KNEE ARTHROPLASTY;  Surgeon: Ollen Gross, MD;  Location: [facility];  Service: Orthopedics;  Laterality: Left;     OB History   No obstetric history on file.     Family History  Problem Relation Age of Onset  . Dementia Other        Died age 67    Social History   Tobacco Use  . Smoking status: Never Smoker  . Smokeless tobacco: Never Used  Substance Use Topics  . Alcohol use: No    Alcohol/week: 0.0 standard drinks  . Drug use: No    Home Medications Prior to Admission medications   Medication Sig Start Date End Date Taking? Authorizing Provider  acetaminophen (TYLENOL) 325 MG tablet Take 2 tablets (650 mg total) by mouth every 6 (six) hours as needed for mild pain (or Fever >/= 101). Patient taking differently: Take 325 mg by mouth every 6 (six) hours as needed for mild pain (or Fever >/= 101).  12/23/14   Perkins, Alexzandrew L, PA-C  bisacodyl (DULCOLAX) 10 MG suppository Place 1 suppository (10 mg total) rectally daily as needed for moderate constipation. Patient not taking: Reported on 12/03/2016 12/23/14   Julien Girt, Alexzandrew L, PA-C  docusate sodium (COLACE) 100 MG capsule Take 1 capsule (100 mg total) by mouth 2 (two) times daily. Patient not taking: Reported on 12/03/2016 12/23/14   Julien Girt, Alexzandrew L, PA-C  latanoprost (XALATAN) 0.005 % ophthalmic solution Place 1 drop into both eyes at bedtime.     [provider]  methocarbamol (ROBAXIN) 500 MG tablet Take 1 tablet (500 mg total) by mouth every 6 (six) hours as needed for muscle spasms. Patient not  taking: Reported on 04/28/2018 12/17/16   Julien Girt, Alexzandrew L, PA-C  metoprolol tartrate (LOPRESSOR) 25 MG tablet Take 25-50 mg by mouth See admin instructions. 1 in the morning and two in the evening     [provider]  omeprazole (PRILOSEC) 40 MG capsule Take 1 capsule (40 mg total) by mouth daily. 01/07/20   Derwood Kaplan, MD  rivaroxaban (XARELTO) 10 MG TABS tablet Take 1 tablet (10 mg total) by mouth daily with breakfast. Take Xarelto for two and a half more weeks following discharge from the hospital, then discontinue Xarelto. Once the patient has completed the blood thinner regimen, then take a Baby 81 mg Aspirin daily for three more weeks. Patient not taking: Reported on 04/28/2018 12/18/16   Julien Girt, Alexzandrew L, PA-C  simethicone (MYLICON) 80 MG chewable tablet Chew 1 tablet (80 mg total) by mouth every 6 (six) hours as needed for flatulence. Patient not taking: Reported on 04/28/2018 11/24/17   Casey Burkitt, MD  sucralfate (CARAFATE) 1 g tablet Take 1 tablet (1 g total) by mouth 4 (four) times daily -  with meals and at bedtime. 01/07/20   Derwood Kaplan, MD  timolol (BETIMOL) 0.5 % ophthalmic solution Place 1 drop into the right eye daily.    [provider]  traMADol (ULTRAM) 50 MG tablet Take 1-2 tablets (50-100 mg total) by mouth every 6 (six) hours as needed for moderate pain. Patient not taking: Reported on 04/28/2018 12/17/16   Julien Girt, Alexzandrew L, PA-C    Allergies    Patient has no known allergies.  Review of Systems   Review of Systems  Constitutional: Positive for activity change.  HENT: Positive for trouble swallowing.   Cardiovascular: Negative for chest pain.  Gastrointestinal: Negative for abdominal pain, nausea and vomiting.  Musculoskeletal: Positive for arthralgias.  Skin: Positive for rash.  Hematological: Does not bruise/bleed easily.    Physical Exam Updated Vital Signs BP (!) 180/81   Pulse 68   Temp 98.3 F (36.8 C)  (Oral)   Resp 15   SpO2 95%   Physical Exam Vitals and nursing note reviewed.  Constitutional:      Appearance: She is well-developed.  HENT:     Head: Normocephalic and atraumatic.     Comments: Oral mucosa looks normal -no edema, ulcers    Nose: No congestion or rhinorrhea.  Eyes:     Pupils: Pupils are equal, round, and reactive to light.  Cardiovascular:     Rate and Rhythm: Normal rate.  Pulmonary:     Effort: Pulmonary effort is normal.  Abdominal:     General: Bowel sounds are normal.  Musculoskeletal:     Cervical back: Normal range of motion and neck supple.  Skin:    General: Skin is warm and dry.  Neurological:  Mental Status: She is alert and oriented to person, place, and time.    ED Results / Procedures / Treatments   Labs (all labs ordered are listed, but only abnormal results are displayed) Labs Reviewed - No data to display  EKG None  Radiology DG Neck Soft Tissue  Result Date: 01/07/2020 CLINICAL DATA:  Neck pain. EXAM: NECK SOFT TISSUES - 1+ VIEW COMPARISON:  None. FINDINGS: Advanced degenerative changes are noted of the cervical spine, greatest at the C4-C5 and C5-C6 levels. There is no radiopaque foreign body. No acute displaced fracture involving the visualized osseous structures. IMPRESSION: 1. No radiopaque foreign body. 2. Advanced degenerative changes of the cervical spine without evidence for an acute osseous abnormality. Electronically Signed   By: Constance Holster M.D.   On: 01/07/2020 18:07    Procedures Procedures (including critical care time)  Medications Ordered in ED Medications  alum & mag hydroxide-simeth (MAALOX/MYLANTA) 200-200-20 MG/5ML suspension 30 mL (30 mLs Oral Given 01/07/20 1747)    ED Course  I have reviewed the triage vital signs and the nursing notes.  Pertinent labs & imaging results that were available during my care of the patient were reviewed by me and considered in my medical decision making (see chart  for details).    MDM Rules/Calculators/A&P                      86 year old comes in a chief complaint of throat discomfort. She is also complaining of difficulty in breathing.  Review of system was positive for rash in her face and also bilateral lower extremity pain.  She has no known history of severe allergies.  She reports that she has had same symptoms in her throat for a long time, recently the symptoms have progressed.  Care everywhere reveals that patient had a swallow evaluation last March.  She was put on Prilosec.  There was concerns for esophageal spasm.  Spoke with patient's daughter who informs me the same history, that patient has been having some GI discomfort for the long time.  She indicates that there was upper endoscopy done prior to the swallow study which was also negative for acute findings.  Patient likely does have some element of anxiety as a result of her esophageal dysmotility/discomfort.  I gave her GI cocktail and reassessed her.  Patient did feel slightly better with the medication.  Soft tissue neck x-rays are negative for acute findings.  Stable for discharge.  Patient's daughter will pick her up.  I advised starting the medications prescribed and following up with PCP.  Final Clinical Impression(s) / ED Diagnoses Final diagnoses:  Esophageal spasm  Esophagitis    Rx / DC Orders ED Discharge Orders         Ordered    omeprazole (PRILOSEC) 40 MG capsule  Daily     01/07/20 1912    sucralfate (CARAFATE) 1 g tablet  3 times daily with meals & bedtime     01/07/20 1912           Varney Biles, MD 01/07/20 1924

## 2020-01-07 NOTE — ED Notes (Signed)
Patient transported to XR. 

## 2020-01-07 NOTE — ED Triage Notes (Addendum)
Pt BIB EMS from [facility]. Pt was requesting a doctor due to bilateral leg pain caused from arthritis. Facility unable to contact POA, daughter. Pt has a rash on her face and reports this is new.   BP 176/98 HR 70 RR 20 98% RA Temp 99.4

## 2020-01-07 NOTE — ED Notes (Signed)
PT daughter called to pick pt up due to pt being discharged.

## 2020-01-07 NOTE — ED Notes (Signed)
Melissa Harris, Would like an update on her mother she can be reached at [phone]

## 2020-01-15 ENCOUNTER — Encounter (HOSPITAL_COMMUNITY): Payer: Self-pay | Admitting: Student

## 2020-01-15 ENCOUNTER — Other Ambulatory Visit: Payer: Self-pay

## 2020-01-15 ENCOUNTER — Emergency Department (HOSPITAL_COMMUNITY): Payer: Medicare Other

## 2020-01-15 ENCOUNTER — Emergency Department (HOSPITAL_COMMUNITY)
Admission: EM | Admit: 2020-01-15 | Discharge: 2020-01-15 | Disposition: A | Discharge status: Home / Self Care | Payer: Medicare Other | Attending: Emergency Medicine | Admitting: Emergency Medicine

## 2020-01-15 DIAGNOSIS — Z79899 Other long term (current) drug therapy: Secondary | ICD-10-CM | POA: Diagnosis not present

## 2020-01-15 DIAGNOSIS — R5383 Other fatigue: Secondary | ICD-10-CM | POA: Insufficient documentation

## 2020-01-15 DIAGNOSIS — R1013 Epigastric pain: Secondary | ICD-10-CM

## 2020-01-15 DIAGNOSIS — R109 Unspecified abdominal pain: Secondary | ICD-10-CM | POA: Diagnosis not present

## 2020-01-15 DIAGNOSIS — I10 Essential (primary) hypertension: Secondary | ICD-10-CM | POA: Insufficient documentation

## 2020-01-15 DIAGNOSIS — Z7901 Long term (current) use of anticoagulants: Secondary | ICD-10-CM | POA: Insufficient documentation

## 2020-01-15 DIAGNOSIS — J029 Acute pharyngitis, unspecified: Secondary | ICD-10-CM | POA: Diagnosis present

## 2020-01-15 LAB — HEPATIC FUNCTION PANEL
ALT: 10 U/L (ref 0–44)
AST: 13 U/L — ABNORMAL LOW (ref 15–41)
Albumin: 4 g/dL (ref 3.5–5.0)
Alkaline Phosphatase: 84 U/L (ref 38–126)
Bilirubin, Direct: 0.1 mg/dL (ref 0.0–0.2)
Indirect Bilirubin: 0.5 mg/dL (ref 0.3–0.9)
Total Bilirubin: 0.6 mg/dL (ref 0.3–1.2)
Total Protein: 7.6 g/dL (ref 6.5–8.1)

## 2020-01-15 LAB — CBC WITH DIFFERENTIAL/PLATELET
Abs Immature Granulocytes: 0.03 10*3/uL (ref 0.00–0.07)
Basophils Absolute: 0.1 10*3/uL (ref 0.0–0.1)
Basophils Relative: 1 %
Eosinophils Absolute: 0.2 10*3/uL (ref 0.0–0.5)
Eosinophils Relative: 3 %
HCT: 42.1 % (ref 36.0–46.0)
Hemoglobin: 14 g/dL (ref 12.0–15.0)
Immature Granulocytes: 0 %
Lymphocytes Relative: 22 %
Lymphs Abs: 1.7 10*3/uL (ref 0.7–4.0)
MCH: 31.7 pg (ref 26.0–34.0)
MCHC: 33.3 g/dL (ref 30.0–36.0)
MCV: 95.2 fL (ref 80.0–100.0)
Monocytes Absolute: 0.8 10*3/uL (ref 0.1–1.0)
Monocytes Relative: 10 %
Neutro Abs: 5 10*3/uL (ref 1.7–7.7)
Neutrophils Relative %: 64 %
Platelets: 253 10*3/uL (ref 150–400)
RBC: 4.42 MIL/uL (ref 3.87–5.11)
RDW: 12.2 % (ref 11.5–15.5)
WBC: 7.8 10*3/uL (ref 4.0–10.5)
nRBC: 0 % (ref 0.0–0.2)

## 2020-01-15 LAB — BASIC METABOLIC PANEL
Anion gap: 9 (ref 5–15)
BUN: 15 mg/dL (ref 8–23)
CO2: 27 mmol/L (ref 22–32)
Calcium: 9.4 mg/dL (ref 8.9–10.3)
Chloride: 103 mmol/L (ref 98–111)
Creatinine, Ser: 1.09 mg/dL — ABNORMAL HIGH (ref 0.44–1.00)
GFR calc Af Amer: 52 mL/min — ABNORMAL LOW (ref >60)
GFR calc non Af Amer: 45 mL/min — ABNORMAL LOW (ref >60)
Glucose, Bld: 107 mg/dL — ABNORMAL HIGH (ref 70–99)
Potassium: 4 mmol/L (ref 3.5–5.1)
Sodium: 139 mmol/L (ref 135–145)

## 2020-01-15 LAB — LIPASE, BLOOD: Lipase: 50 U/L (ref 11–51)

## 2020-01-15 MED ORDER — LIDOCAINE VISCOUS HCL 2 % MT SOLN
15.0000 mL | Freq: Once | OROMUCOSAL | Status: AC
  Administered 2020-01-15: 20:00:00 15 mL via ORAL
  Filled 2020-01-15: qty 15

## 2020-01-15 MED ORDER — SODIUM CHLORIDE 0.9 % IV BOLUS
1000.0000 mL | Freq: Once | INTRAVENOUS | Status: AC
  Administered 2020-01-15: 1000 mL via INTRAVENOUS

## 2020-01-15 MED ORDER — ONDANSETRON HCL 4 MG/2ML IJ SOLN
4.0000 mg | Freq: Once | INTRAMUSCULAR | Status: AC
  Administered 2020-01-15: 4 mg via INTRAVENOUS
  Filled 2020-01-15: qty 2

## 2020-01-15 MED ORDER — SODIUM CHLORIDE (PF) 0.9 % IJ SOLN
INTRAMUSCULAR | Status: AC
  Filled 2020-01-15: qty 50

## 2020-01-15 MED ORDER — ALUM & MAG HYDROXIDE-SIMETH 200-200-20 MG/5ML PO SUSP
30.0000 mL | Freq: Once | ORAL | Status: AC
  Administered 2020-01-15: 30 mL via ORAL
  Filled 2020-01-15: qty 30

## 2020-01-15 NOTE — ED Notes (Signed)
Patient transported to CT 

## 2020-01-15 NOTE — ED Notes (Signed)
Save tubes sent to lab. Rainbow and lactic.

## 2020-01-15 NOTE — ED Provider Notes (Signed)
[facility] DEPT Provider Note   CSN: [id number] Arrival date & time: 01/15/20  1701     History Chief Complaint  Patient presents with  . Sore Throat  . Abdominal Pain    Melissa Harris is a 86 y.o. female.  The history is provided by the patient.  Abdominal Pain Pain location:  Epigastric Pain quality: aching   Pain radiates to:  Does not radiate Pain severity:  Mild Onset quality:  Gradual Timing:  Intermittent Progression:  Waxing and waning Chronicity:  Chronic Context: eating   Relieved by:  Nothing Worsened by:  Nothing Associated symptoms: belching, constipation, fatigue, nausea and vomiting   Associated symptoms: no anorexia, no chest pain, no chills, no cough, no dysuria, no fever, no hematuria, no shortness of breath and no sore throat   Risk factors comment:  Esophageal spasms      Past Medical History:  Diagnosis Date  . Carpal tunnel syndrome    left   . Depression   . GERD (gastroesophageal reflux disease)   . Hypertension   . Nocturia   . Osteoarthritis     Patient Active Problem List   Diagnosis Date Noted  . OA (osteoarthritis) of hip 12/15/2016  . OA (osteoarthritis) of knee 12/20/2014  . Preop cardiovascular exam 11/28/2014  . Palpitations 11/28/2014    Past Surgical History:  Procedure Laterality Date  . APPENDECTOMY    . CATARACT EXTRACTION    . CHOLECYSTECTOMY    . TONSILLECTOMY AND ADENOIDECTOMY    . TOTAL HIP ARTHROPLASTY Right   . TOTAL HIP ARTHROPLASTY Left 12/15/2016   Procedure: LEFT TOTAL HIP ARTHROPLASTY ANTERIOR APPROACH;  Surgeon: Gaynelle Arabian, MD;  Location: [facility];  Service: Orthopedics;  Laterality: Left;  requests 141mins  . TOTAL KNEE ARTHROPLASTY Right   . TOTAL KNEE ARTHROPLASTY Left 12/20/2014   Procedure: LEFT TOTAL KNEE ARTHROPLASTY;  Surgeon: Gaynelle Arabian, MD;  Location: [facility];  Service: Orthopedics;  Laterality: Left;     OB History   No obstetric history on file.      Family History  Problem Relation Age of Onset  . Dementia Other        Died age 75    Social History   Tobacco Use  . Smoking status: Never Smoker  . Smokeless tobacco: Never Used  Substance Use Topics  . Alcohol use: No    Alcohol/week: 0.0 standard drinks  . Drug use: No    Home Medications Prior to Admission medications   Medication Sig Start Date End Date Taking? Authorizing Provider  acetaminophen (TYLENOL) 325 MG tablet Take 2 tablets (650 mg total) by mouth every 6 (six) hours as needed for mild pain (or Fever >/= 101). Patient taking differently: Take 325 mg by mouth every 6 (six) hours as needed for mild pain (or Fever >/= 101).  12/23/14   Perkins, Alexzandrew L, PA-C  bisacodyl (DULCOLAX) 10 MG suppository Place 1 suppository (10 mg total) rectally daily as needed for moderate constipation. Patient not taking: Reported on 12/03/2016 12/23/14   Dara Lords, Alexzandrew L, PA-C  docusate sodium (COLACE) 100 MG capsule Take 1 capsule (100 mg total) by mouth 2 (two) times daily. Patient not taking: Reported on 12/03/2016 12/23/14   Dara Lords, Alexzandrew L, PA-C  latanoprost (XALATAN) 0.005 % ophthalmic solution Place 1 drop into both eyes at bedtime.     [provider]  methocarbamol (ROBAXIN) 500 MG tablet Take 1 tablet (500 mg total) by mouth every 6 (six)  hours as needed for muscle spasms. Patient not taking: Reported on 04/28/2018 12/17/16   Julien Girt, Alexzandrew L, PA-C  metoprolol tartrate (LOPRESSOR) 25 MG tablet Take 25-50 mg by mouth See admin instructions. 1 in the morning and two in the evening     [provider]  omeprazole (PRILOSEC) 40 MG capsule Take 1 capsule (40 mg total) by mouth daily. 01/07/20   Derwood Kaplan, MD  rivaroxaban (XARELTO) 10 MG TABS tablet Take 1 tablet (10 mg total) by mouth daily with breakfast. Take Xarelto for two and a half more weeks following discharge from the hospital, then discontinue Xarelto. Once the patient has  completed the blood thinner regimen, then take a Baby 81 mg Aspirin daily for three more weeks. Patient not taking: Reported on 04/28/2018 12/18/16   Julien Girt, Alexzandrew L, PA-C  simethicone (MYLICON) 80 MG chewable tablet Chew 1 tablet (80 mg total) by mouth every 6 (six) hours as needed for flatulence. Patient not taking: Reported on 04/28/2018 11/24/17   Casey Burkitt, MD  sucralfate (CARAFATE) 1 g tablet Take 1 tablet (1 g total) by mouth 4 (four) times daily -  with meals and at bedtime. 01/07/20   Derwood Kaplan, MD  timolol (BETIMOL) 0.5 % ophthalmic solution Place 1 drop into the right eye daily.    [provider]  traMADol (ULTRAM) 50 MG tablet Take 1-2 tablets (50-100 mg total) by mouth every 6 (six) hours as needed for moderate pain. Patient not taking: Reported on 04/28/2018 12/17/16   Julien Girt, Alexzandrew L, PA-C    Allergies    Patient has no known allergies.  Review of Systems   Review of Systems  Constitutional: Positive for fatigue. Negative for chills and fever.  HENT: Negative for ear pain and sore throat.   Eyes: Negative for pain and visual disturbance.  Respiratory: Negative for cough and shortness of breath.   Cardiovascular: Negative for chest pain and palpitations.  Gastrointestinal: Positive for abdominal pain, constipation, nausea and vomiting. Negative for anorexia.  Genitourinary: Negative for dysuria and hematuria.  Musculoskeletal: Negative for arthralgias and back pain.  Skin: Negative for color change and rash.  Neurological: Negative for seizures and syncope.  All other systems reviewed and are negative.   Physical Exam Updated Vital Signs  ED Triage Vitals [01/15/20 1713]  Enc Vitals Group     BP (!) 155/81     Pulse Rate 61     Resp (!) 23     Temp 98.5 F (36.9 C)     Temp Source Oral     SpO2 98 %     Weight      Height      Head Circumference      Peak Flow      Pain Score      Pain Loc      Pain Edu?      Excl. in  GC?     Physical Exam Vitals and nursing note reviewed.  Constitutional:      General: She is not in acute distress.    Appearance: She is well-developed. She is not ill-appearing.  HENT:     Head: Normocephalic and atraumatic.     Mouth/Throat:     Mouth: Mucous membranes are moist.     Pharynx: No oropharyngeal exudate or posterior oropharyngeal erythema.     Tonsils: No tonsillar exudate or tonsillar abscesses.  Eyes:     Conjunctiva/sclera: Conjunctivae normal.  Cardiovascular:     Rate and Rhythm:  Normal rate and regular rhythm.     Heart sounds: Normal heart sounds. No murmur.  Pulmonary:     Effort: Pulmonary effort is normal. No respiratory distress.     Breath sounds: Normal breath sounds.  Abdominal:     General: There is distension.     Palpations: Abdomen is soft.     Tenderness: There is abdominal tenderness (epigastric region ).  Musculoskeletal:     Cervical back: Neck supple.  Skin:    General: Skin is warm and dry.     Capillary Refill: Capillary refill takes less than 2 seconds.  Neurological:     General: No focal deficit present.     Mental Status: She is alert.  Psychiatric:        Mood and Affect: Mood normal.     ED Results / Procedures / Treatments   Labs (all labs ordered are listed, but only abnormal results are displayed) Labs Reviewed  BASIC METABOLIC PANEL - Abnormal; Notable for the following components:      Result Value   Glucose, Bld 107 (*)    Creatinine, Ser 1.09 (*)    GFR calc non Af Amer 45 (*)    GFR calc Af Amer 52 (*)    All other components within normal limits  HEPATIC FUNCTION PANEL - Abnormal; Notable for the following components:   AST 13 (*)    All other components within normal limits  CBC WITH DIFFERENTIAL/PLATELET  LIPASE, BLOOD    EKG EKG Interpretation  Date/Time:  Tuesday January 15 2020 18:39:32 EDT Ventricular Rate:  58 PR Interval:  180 QRS Duration: 84 QT Interval:  418 QTC Calculation: 410 R  Axis:   -7 Text Interpretation: Sinus bradycardia Nonspecific T wave abnormality Abnormal ECG Confirmed by Virgina Norfolk [id number]) on 01/15/2020 6:58:45 PM   Radiology CT ABDOMEN PELVIS WO CONTRAST  Result Date: 01/15/2020 CLINICAL DATA:  Abdominal distension. EXAM: CT ABDOMEN AND PELVIS WITHOUT CONTRAST TECHNIQUE: Multidetector CT imaging of the abdomen and pelvis was performed following the standard protocol without IV contrast. COMPARISON:  None. FINDINGS: Lower chest: The lung bases are clear. The heart size is normal. Hepatobiliary: The liver is normal. Status post cholecystectomy.There is no biliary ductal dilation. Pancreas: Normal contours without ductal dilatation. No peripancreatic fluid collection. Spleen: Unremarkable. Adrenals/Urinary Tract: --Adrenal glands: Unremarkable. --Right kidney/ureter: No hydronephrosis or radiopaque kidney stones. --Left kidney/ureter: There is a nonobstructing stone measuring approximately 5 mm in the interpolar region of the left kidney. --Urinary bladder: Unremarkable. Stomach/Bowel: --Stomach/Duodenum: No hiatal hernia or other gastric abnormality. Normal duodenal course and caliber. --Small bowel: Unremarkable. --Colon: Rectosigmoid diverticulosis without acute inflammation. --Appendix: Not visualized. No right lower quadrant inflammation or free fluid. Vascular/Lymphatic: Atherosclerotic calcification is present within the non-aneurysmal abdominal aorta, without hemodynamically significant stenosis. --No retroperitoneal lymphadenopathy. --No mesenteric lymphadenopathy. --No pelvic or inguinal lymphadenopathy. Reproductive: Unremarkable Other: No ascites or free air. There is a fat containing umbilical hernia. Musculoskeletal. The patient is status post bilateral total hip arthroplasty. IMPRESSION: 1. Rectosigmoid diverticulosis without diverticulitis. 2. Nonobstructive left nephrolithiasis. 3. Status post cholecystectomy. 4. Aortic Atherosclerosis (ICD10-I70.0).  Electronically Signed   By: Katherine Mantle M.D.   On: 01/15/2020 19:14   DG Chest 2 View  Result Date: 01/15/2020 CLINICAL DATA:  86 year old female with weakness. EXAM: CHEST - 2 VIEW COMPARISON:  Chest radiograph dated 10/22/2014. FINDINGS: No focal consolidation, pleural effusion, pneumothorax. Stable cardiac silhouette. Atherosclerotic calcification of the aorta. Osteopenia with degenerative changes of the spine. No acute osseous  pathology. IMPRESSION: No active cardiopulmonary disease. Electronically Signed   By: Elgie Collard M.D.   On: 01/15/2020 18:18    Procedures Procedures (including critical care time)  Medications Ordered in ED Medications  sodium chloride (PF) 0.9 % injection (has no administration in time range)  ondansetron (ZOFRAN) injection 4 mg (4 mg Intravenous Given 01/15/20 1824)  sodium chloride 0.9 % bolus 1,000 mL (0 mLs Intravenous Stopped 01/15/20 2025)  alum & mag hydroxide-simeth (MAALOX/MYLANTA) 200-200-20 MG/5ML suspension 30 mL (30 mLs Oral Given 01/15/20 1942)    And  lidocaine (XYLOCAINE) 2 % viscous mouth solution 15 mL (15 mLs Oral Given 01/15/20 1942)    ED Course  I have reviewed the triage vital signs and the nursing notes.  Pertinent labs & imaging results that were available during my care of the patient were reviewed by me and considered in my medical decision making (see chart for details).    MDM Rules/Calculators/A&P                      HAZELY SEALEY is a 86 year old female with history of dementia, chronic abdominal pain who presents to the ED with epigastric abdominal pain.  Patient with overall unremarkable vitals.  Ongoing pain for the last several months.  Is on reflux medications.  States that it hurts sometimes when she eats.  Lab work showed no significant anemia, which is abnormality, kidney injury.  Gallbladder and liver enzymes normal.  Lipase normal.  No concern for pancreatitis.  CT scan of the abdomen pelvis showed no bowel  obstruction, no appendicitis, no perforation.  No diverticulitis.  Chest x-ray showed no infectious process.  Patient was given GI cocktail with improvement of her pain.  Overall suspect chronic abdominal process.  Likely gastritis.  Recommend continued reflux medications.  Discharged in good condition.  Recommend follow-up with GI.  This chart was dictated using voice recognition software.  Despite best efforts to proofread,  errors can occur which can change the documentation meaning.    Final Clinical Impression(s) / ED Diagnoses Final diagnoses:  Epigastric pain    Rx / DC Orders ED Discharge Orders    None       Virgina Norfolk, DO 01/15/20 2050

## 2020-01-15 NOTE — ED Triage Notes (Signed)
Per pt, states she feels like she has a "blockage" in her throat-has been seen for same symptoms-symptoms going on for months, chronic issue

## 2020-01-15 NOTE — Discharge Instructions (Addendum)
Follow up with your GI doctor

## 2020-01-15 NOTE — ED Notes (Addendum)
Made patient's son Verdon Cummins aware that she is here.  Son stated, his mother has dementia and there is noting wrong with her, that she was just here last week.  Attempted to call the daughter with no answer.

## 2020-02-11 ENCOUNTER — Encounter (HOSPITAL_COMMUNITY): Payer: Self-pay | Admitting: Student

## 2020-02-11 ENCOUNTER — Emergency Department (HOSPITAL_COMMUNITY)
Admission: EM | Admit: 2020-02-11 | Discharge: 2020-02-11 | Disposition: A | Discharge status: Home / Self Care | Payer: Medicare Other | Attending: Emergency Medicine | Admitting: Emergency Medicine

## 2020-02-11 ENCOUNTER — Emergency Department (HOSPITAL_COMMUNITY): Payer: Medicare Other

## 2020-02-11 DIAGNOSIS — Z96653 Presence of artificial knee joint, bilateral: Secondary | ICD-10-CM | POA: Insufficient documentation

## 2020-02-11 DIAGNOSIS — Z79899 Other long term (current) drug therapy: Secondary | ICD-10-CM | POA: Insufficient documentation

## 2020-02-11 DIAGNOSIS — I10 Essential (primary) hypertension: Secondary | ICD-10-CM | POA: Insufficient documentation

## 2020-02-11 DIAGNOSIS — K224 Dyskinesia of esophagus: Secondary | ICD-10-CM

## 2020-02-11 DIAGNOSIS — R05 Cough: Secondary | ICD-10-CM | POA: Diagnosis present

## 2020-02-11 LAB — CBC WITH DIFFERENTIAL/PLATELET
Abs Immature Granulocytes: 0.05 10*3/uL (ref 0.00–0.07)
Basophils Absolute: 0.1 10*3/uL (ref 0.0–0.1)
Basophils Relative: 1 %
Eosinophils Absolute: 0.2 10*3/uL (ref 0.0–0.5)
Eosinophils Relative: 2 %
HCT: 39.7 % (ref 36.0–46.0)
Hemoglobin: 13.3 g/dL (ref 12.0–15.0)
Immature Granulocytes: 1 %
Lymphocytes Relative: 15 %
Lymphs Abs: 1.3 10*3/uL (ref 0.7–4.0)
MCH: 31.7 pg (ref 26.0–34.0)
MCHC: 33.5 g/dL (ref 30.0–36.0)
MCV: 94.5 fL (ref 80.0–100.0)
Monocytes Absolute: 1 10*3/uL (ref 0.1–1.0)
Monocytes Relative: 11 %
Neutro Abs: 6.2 10*3/uL (ref 1.7–7.7)
Neutrophils Relative %: 70 %
Platelets: 220 10*3/uL (ref 150–400)
RBC: 4.2 MIL/uL (ref 3.87–5.11)
RDW: 12.3 % (ref 11.5–15.5)
WBC: 8.7 10*3/uL (ref 4.0–10.5)
nRBC: 0 % (ref 0.0–0.2)

## 2020-02-11 LAB — URINALYSIS, ROUTINE W REFLEX MICROSCOPIC
Bilirubin Urine: NEGATIVE
Glucose, UA: NEGATIVE mg/dL
Ketones, ur: NEGATIVE mg/dL
Nitrite: NEGATIVE
Protein, ur: NEGATIVE mg/dL
Specific Gravity, Urine: 1.018 (ref 1.005–1.030)
pH: 6 (ref 5.0–8.0)

## 2020-02-11 LAB — COMPREHENSIVE METABOLIC PANEL
ALT: 12 U/L (ref 0–44)
AST: 11 U/L — ABNORMAL LOW (ref 15–41)
Albumin: 3.6 g/dL (ref 3.5–5.0)
Alkaline Phosphatase: 74 U/L (ref 38–126)
Anion gap: 9 (ref 5–15)
BUN: 16 mg/dL (ref 8–23)
CO2: 25 mmol/L (ref 22–32)
Calcium: 8.8 mg/dL — ABNORMAL LOW (ref 8.9–10.3)
Chloride: 103 mmol/L (ref 98–111)
Creatinine, Ser: 0.83 mg/dL (ref 0.44–1.00)
GFR calc Af Amer: >60 mL/min (ref >60)
GFR calc non Af Amer: >60 mL/min (ref >60)
Glucose, Bld: 104 mg/dL — ABNORMAL HIGH (ref 70–99)
Potassium: 3.4 mmol/L — ABNORMAL LOW (ref 3.5–5.1)
Sodium: 137 mmol/L (ref 135–145)
Total Bilirubin: 0.8 mg/dL (ref 0.3–1.2)
Total Protein: 6.9 g/dL (ref 6.5–8.1)

## 2020-02-11 LAB — LIPASE, BLOOD: Lipase: 38 U/L (ref 11–51)

## 2020-02-11 MED ORDER — OMEPRAZOLE 40 MG PO CPDR: 40.0000 mg | DELAYED_RELEASE_CAPSULE | Freq: Every day | ORAL | 1 refills | Status: DC

## 2020-02-11 MED ORDER — ALUM & MAG HYDROXIDE-SIMETH 200-200-20 MG/5ML PO SUSP
30.0000 mL | Freq: Once | ORAL | Status: AC
  Administered 2020-02-11: 30 mL via ORAL
  Filled 2020-02-11: qty 30

## 2020-02-11 MED ORDER — LIDOCAINE VISCOUS HCL 2 % MT SOLN
15.0000 mL | Freq: Once | OROMUCOSAL | Status: AC
  Administered 2020-02-11: 15 mL via ORAL
  Filled 2020-02-11: qty 15

## 2020-02-11 NOTE — ED Triage Notes (Signed)
Patient arrived via [facility] from [facility].    C/O cough X1 month and c/o of severe throat irritation for past 2 days per ems   Pt also mentioned she is hurting "all over".   Per ems pt seems that she has dementia   Hx. Hypertension   No known allergies.   A/Ox4 Ambulatory per ems

## 2020-02-11 NOTE — ED Notes (Signed)
[facility] called for transport; [facility] called to give update on pt's discharge

## 2020-02-11 NOTE — ED Provider Notes (Signed)
[facility]-EMERGENCY DEPT Provider Note   CSN: [id number] Arrival date & time: 02/11/20  1550     History Chief Complaint  Patient presents with  . Cough    Melissa Harris is a 86 y.o. female.  HPI 86 year old female with a history of dementia, GERD, esophageal spasms, hypertension presents to the ER for difficulty swallowing and "not being able to breathe, my eyelids feel very heavy and I just ache all over."  Patient has a longstanding history of esophageal spasms with many ER visits, patient had a scope done in February which was negative.  She does have a history of a gastric ulcer in 2017.  She has been placed on PPIs.  She endorses difficulty eating over the last few days has been able to tolerate liquids but not solids.  Per chart review, she was placed on 40 mg omeprazole and an antianxiety medication by GI.  Patient's daughter and GI suspect that this may be secondary to to anxiety.  She denies fevers, chills, nausea, vomiting, dysuria, hematuria, diarrhea.  She endorses some generalized abdominal pain but cannot localize it.  She states that she is having difficulty breathing, states that her "throat is blocked".  She is very anxious and states that this is the worst pain that she has ever had.  He is unclear if she has been taking her omeprazole.   Patient resides at [facility] in [place] [facility], I attempted to contact them and they did not have anyone on staff who knew the patient well enough..  I was able to speak with the patient's son who states that this is a longstanding ongoing problem.  He says that she frequently calls EMS when she is anxious which flares up her esophageal spasms.  He has not actively involved in her day-to-day, states that her his sister is the primary care of attorney.  I attempted to contact her without success.   Past Medical History:  Diagnosis Date  . Carpal tunnel syndrome    left   . Depression   . GERD  (gastroesophageal reflux disease)   . Hypertension   . Nocturia   . Osteoarthritis     Patient Active Problem List   Diagnosis Date Noted  . OA (osteoarthritis) of hip 12/15/2016  . OA (osteoarthritis) of knee 12/20/2014  . Preop cardiovascular exam 11/28/2014  . Palpitations 11/28/2014    Past Surgical History:  Procedure Laterality Date  . APPENDECTOMY    . CATARACT EXTRACTION    . CHOLECYSTECTOMY    . TONSILLECTOMY AND ADENOIDECTOMY    . TOTAL HIP ARTHROPLASTY Right   . TOTAL HIP ARTHROPLASTY Left 12/15/2016   Procedure: LEFT TOTAL HIP ARTHROPLASTY ANTERIOR APPROACH;  Surgeon: Ollen GrossFrank Aluisio, MD;  Location: [facility];  Service: Orthopedics;  Laterality: Left;  requests 105mins  . TOTAL KNEE ARTHROPLASTY Right   . TOTAL KNEE ARTHROPLASTY Left 12/20/2014   Procedure: LEFT TOTAL KNEE ARTHROPLASTY;  Surgeon: Ollen GrossFrank Aluisio, MD;  Location: [facility];  Service: Orthopedics;  Laterality: Left;     OB History   No obstetric history on file.     Family History  Problem Relation Age of Onset  . Dementia Other        Died age 89    Social History   Tobacco Use  . Smoking status: Never Smoker  . Smokeless tobacco: Never Used  Substance Use Topics  . Alcohol use: No    Alcohol/week: 0.0 standard drinks  . Drug use:  No    Home Medications Prior to Admission medications   Medication Sig Start Date End Date Taking? Authorizing Provider  acetaminophen (TYLENOL) 325 MG tablet Take 2 tablets (650 mg total) by mouth every 6 (six) hours as needed for mild pain (or Fever >/= 101). Patient taking differently: Take 325 mg by mouth every 6 (six) hours as needed for mild pain (or Fever >/= 101).  12/23/14   Perkins, Alexzandrew L, PA-C  bisacodyl (DULCOLAX) 10 MG suppository Place 1 suppository (10 mg total) rectally daily as needed for moderate constipation. Patient not taking: Reported on 12/03/2016 12/23/14   Julien Girt, Alexzandrew L, PA-C  docusate sodium (COLACE) 100 MG capsule Take 1 capsule  (100 mg total) by mouth 2 (two) times daily. Patient not taking: Reported on 12/03/2016 12/23/14   Julien Girt, Alexzandrew L, PA-C  latanoprost (XALATAN) 0.005 % ophthalmic solution Place 1 drop into both eyes at bedtime.     [provider]  methocarbamol (ROBAXIN) 500 MG tablet Take 1 tablet (500 mg total) by mouth every 6 (six) hours as needed for muscle spasms. Patient not taking: Reported on 04/28/2018 12/17/16   Julien Girt, Alexzandrew L, PA-C  metoprolol tartrate (LOPRESSOR) 25 MG tablet Take 25-50 mg by mouth See admin instructions. 1 in the morning and two in the evening     [provider]  omeprazole (PRILOSEC) 40 MG capsule Take 1 capsule (40 mg total) by mouth daily. 02/11/20   Mare Ferrari, PA-C  rivaroxaban (XARELTO) 10 MG TABS tablet Take 1 tablet (10 mg total) by mouth daily with breakfast. Take Xarelto for two and a half more weeks following discharge from the hospital, then discontinue Xarelto. Once the patient has completed the blood thinner regimen, then take a Baby 81 mg Aspirin daily for three more weeks. Patient not taking: Reported on 04/28/2018 12/18/16   Julien Girt, Alexzandrew L, PA-C  simethicone (MYLICON) 80 MG chewable tablet Chew 1 tablet (80 mg total) by mouth every 6 (six) hours as needed for flatulence. Patient not taking: Reported on 04/28/2018 11/24/17   Casey Burkitt, MD  sucralfate (CARAFATE) 1 g tablet Take 1 tablet (1 g total) by mouth 4 (four) times daily -  with meals and at bedtime. 01/07/20   Derwood Kaplan, MD  timolol (BETIMOL) 0.5 % ophthalmic solution Place 1 drop into the right eye daily.    [provider]  traMADol (ULTRAM) 50 MG tablet Take 1-2 tablets (50-100 mg total) by mouth every 6 (six) hours as needed for moderate pain. Patient not taking: Reported on 04/28/2018 12/17/16   Julien Girt, Alexzandrew L, PA-C    Allergies    Patient has no known allergies.  Review of Systems   Review of Systems  Constitutional: Negative for  chills, fatigue and fever.  HENT: Positive for sore throat and trouble swallowing. Negative for congestion, dental problem, drooling, ear pain, mouth sores, nosebleeds, sinus pressure, sinus pain, tinnitus and voice change.   Eyes: Negative for pain and visual disturbance.  Respiratory: Positive for cough, choking and shortness of breath.   Cardiovascular: Negative for chest pain, palpitations and leg swelling.  Gastrointestinal: Positive for abdominal pain. Negative for constipation, diarrhea, nausea and vomiting.  Genitourinary: Negative for dysuria, hematuria and pelvic pain.  Musculoskeletal: Negative for arthralgias, back pain, neck pain and neck stiffness.  Skin: Negative for color change and rash.  Neurological: Negative for seizures and syncope.  Psychiatric/Behavioral: Negative for confusion.  All other systems reviewed and are negative.   Physical Exam  Updated Vital Signs BP 134/66 (BP Location: Right Arm)   Pulse 89   Temp 98.8 F (37.1 C) (Oral)   Resp 19   SpO2 96%   Physical Exam Vitals and nursing note reviewed.  Constitutional:      General: She is not in acute distress.    Appearance: She is well-developed. She is not ill-appearing, toxic-appearing or diaphoretic.     Comments: Anxious appearing  HENT:     Head: Normocephalic and atraumatic.     Nose: Nose normal. No congestion.     Mouth/Throat:     Mouth: Mucous membranes are moist.     Pharynx: Oropharynx is clear. No oropharyngeal exudate or posterior oropharyngeal erythema.     Comments: No evidence of ulcers, uvula midline, airway clear.  No rashes, erythema to throat, no tenderness to palpation Eyes:     Extraocular Movements: Extraocular movements intact.     Conjunctiva/sclera: Conjunctivae normal.     Pupils: Pupils are equal, round, and reactive to light.  Cardiovascular:     Rate and Rhythm: Normal rate and regular rhythm.     Heart sounds: No murmur.  Pulmonary:     Effort: Pulmonary effort is  normal. No respiratory distress.     Breath sounds: Normal breath sounds.  Abdominal:     General: Abdomen is flat.     Palpations: Abdomen is soft.     Tenderness: There is abdominal tenderness (To midepigastric region).  Musculoskeletal:     Cervical back: Normal range of motion and neck supple. No rigidity or tenderness.  Skin:    General: Skin is warm and dry.     Findings: No bruising, erythema or rash.  Neurological:     General: No focal deficit present.     Mental Status: She is alert and oriented to person, place, and time.     ED Results / Procedures / Treatments   Labs (all labs ordered are listed, but only abnormal results are displayed) Labs Reviewed  URINALYSIS, ROUTINE W REFLEX MICROSCOPIC - Abnormal; Notable for the following components:      Result Value   Hgb urine dipstick SMALL (*)    Leukocytes,Ua TRACE (*)    Bacteria, UA RARE (*)    All other components within normal limits  COMPREHENSIVE METABOLIC PANEL - Abnormal; Notable for the following components:   Potassium 3.4 (*)    Glucose, Bld 104 (*)    Calcium 8.8 (*)    AST 11 (*)    All other components within normal limits  CBC WITH DIFFERENTIAL/PLATELET  LIPASE, BLOOD    EKG None  Radiology DG Chest 2 View  Result Date: 02/11/2020 CLINICAL DATA:  Cough and shortness of breath EXAM: CHEST - 2 VIEW COMPARISON:  01/15/2020 FINDINGS: No focal opacity or pleural effusion. Stable cardiomediastinal silhouette with aortic atherosclerosis. Questionable spiculated opacity at the left apex. No pneumothorax. Advanced degenerative change of the right greater than left shoulder with probable inferior pedunculated osteophyte. IMPRESSION: No active cardiopulmonary disease. Questionable spiculated opacity at the left apex, consider further evaluation with chest CT. Electronically Signed   By: Jasmine Pang M.D.   On: 02/11/2020 16:32   CT Chest Wo Contrast  Result Date: 02/11/2020 CLINICAL DATA:  Abnormal chest  x-ray EXAM: CT CHEST WITHOUT CONTRAST TECHNIQUE: Multidetector CT imaging of the chest was performed following the standard protocol without IV contrast. COMPARISON:  Chest x-ray earlier today FINDINGS: Cardiovascular: Heart is normal size. Coronary artery and aortic atherosclerosis. No aneurysm.  Mediastinum/Nodes: No mediastinal, hilar, or axillary adenopathy. Trachea and esophagus are unremarkable. Thyroid unremarkable. Lungs/Pleura: Linear areas of scarring in the lung bases bilaterally. No confluent opacities or effusions. No suspicious nodules. No abnormality seen in the left apex as questioned on chest x-ray. Upper Abdomen: Imaging into the upper abdomen shows no acute findings. Small nonobstructing stone in the midpole of the left kidney. Musculoskeletal: Chest wall soft tissues are unremarkable. No acute bony abnormality. IMPRESSION: No abnormality seen in the left apex as questioned on chest x-ray. No acute cardiopulmonary disease. Coronary artery disease. Aortic Atherosclerosis (ICD10-I70.0). Electronically Signed   By: Rolm Baptise M.D.   On: 02/11/2020 17:24    Procedures Procedures (including critical care time)  Medications Ordered in ED Medications  alum & mag hydroxide-simeth (MAALOX/MYLANTA) 200-200-20 MG/5ML suspension 30 mL (30 mLs Oral Given 02/11/20 1700)    And  lidocaine (XYLOCAINE) 2 % viscous mouth solution 15 mL (15 mLs Oral Given 02/11/20 1700)    ED Course  I have reviewed the triage vital signs and the nursing notes.  Pertinent labs & imaging results that were available during my care of the patient were reviewed by me and considered in my medical decision making (see chart for details).    MDM Rules/Calculators/A&P                     86 year old female with longstanding history of esophageal spasms, GERD presents with difficulty swallowing, breathing. On presentation to the ER, patient is very anxious, speaking quickly, referring to me that she cannot breathe or  swallow, stating that her eyelids are heavy and that she hurts all over.  She is very visibly anxious.  Vitals overall reassuring.  Physical exam positive for diffuse midepigastric pain.  Patient is belching frequently has not taken the history.  She does have a history of baseline dementia, conversation with the family indicates that this has been a longstanding problem, likely triggered by the patient's anxiety.  It is unclear if she has been taking her omeprazole or her antianxiety medications.  We will start with basic psych labs, chest x-ray, patient given GI cocktail.  4:45 PM: CBC without leukocytosis, normal hemoglobin.  CMP with mildly decreased potassium 3.4, likely noncontributory.  No other significant electrolyte normalities or evidence of renal dysfunction.  Lipase normal.  Liver enzymes normal.  Chest x-ray shows possible spiculated opacity of the left apex, will follow up with chest CT  5:55 PM: CT negative for acute cardiopulmonary process with stable CAD.  I do not think a CT of the abdomen is indicated at this time as the midepigastric pain appears consistent with GERD and pt just had a scan on 01/15/20 hat did not show any acute pathology.   6:10 PM: Spoke with the patient's daughter, she states that this has been ongoing problem with the patient constantly clearing her throat.  She is suspects this is from anxiety.  We discussed plans of care for her, and agreed that she would make an appointment with GI to reevaluate medication regimen.  It appears that the patient is not taking any anti-anxiety medications, we discussed her following up with her PCP to potentially restart the medication.  Patient's daughter was reassured and was agreeable to this plan.  UA pending.  If this is normal, I suspect the patient will be stable for discharge.  7:10 PM: UA without evidence for UTI.  On reexamination, the patient notes slight improvement in her symptoms though she does still  appear anxious.   Given previous conversation with daughter, believe the patient has been adequately screened and is stable for discharge.  Patient and daughter have voiced understanding and are agreeable to this plan.  The patient was seen and evaluated by Dr. Criss Alvine and he is agreeable to the above plan.    Final Clinical Impression(s) / ED Diagnoses Final diagnoses:  Esophageal spasm    Rx / DC Orders ED Discharge Orders         Ordered    omeprazole (PRILOSEC) 40 MG capsule  Daily     02/11/20 1840           Leone Brand 02/11/20 1924    Pricilla Loveless, MD 02/12/20 [phone]

## 2020-02-11 NOTE — Discharge Instructions (Signed)
Please make sure to follow up with your primary doctor and GI doctor to continue management of your symptoms.Your work up today was reassuring. Return to the ER if your symptoms worsen.

## 2020-04-01 ENCOUNTER — Emergency Department (HOSPITAL_BASED_OUTPATIENT_CLINIC_OR_DEPARTMENT_OTHER)
Admission: EM | Admit: 2020-04-01 | Discharge: 2020-04-01 | Disposition: A | Discharge status: Home / Self Care | Payer: Medicare Other | Attending: Emergency Medicine | Admitting: Emergency Medicine

## 2020-04-01 ENCOUNTER — Encounter (HOSPITAL_BASED_OUTPATIENT_CLINIC_OR_DEPARTMENT_OTHER): Payer: Self-pay | Admitting: Emergency Medicine

## 2020-04-01 ENCOUNTER — Other Ambulatory Visit: Payer: Self-pay

## 2020-04-01 DIAGNOSIS — Z96652 Presence of left artificial knee joint: Secondary | ICD-10-CM | POA: Insufficient documentation

## 2020-04-01 DIAGNOSIS — R198 Other specified symptoms and signs involving the digestive system and abdomen: Secondary | ICD-10-CM

## 2020-04-01 DIAGNOSIS — I10 Essential (primary) hypertension: Secondary | ICD-10-CM | POA: Diagnosis not present

## 2020-04-01 DIAGNOSIS — Z7901 Long term (current) use of anticoagulants: Secondary | ICD-10-CM | POA: Insufficient documentation

## 2020-04-01 DIAGNOSIS — J029 Acute pharyngitis, unspecified: Secondary | ICD-10-CM | POA: Diagnosis not present

## 2020-04-01 DIAGNOSIS — F458 Other somatoform disorders: Secondary | ICD-10-CM | POA: Insufficient documentation

## 2020-04-01 DIAGNOSIS — Z79899 Other long term (current) drug therapy: Secondary | ICD-10-CM | POA: Diagnosis not present

## 2020-04-01 DIAGNOSIS — R05 Cough: Secondary | ICD-10-CM | POA: Diagnosis present

## 2020-04-01 DIAGNOSIS — R0989 Other specified symptoms and signs involving the circulatory and respiratory systems: Secondary | ICD-10-CM

## 2020-04-01 MED ORDER — LIDOCAINE VISCOUS HCL 2 % MT SOLN
15.0000 mL | Freq: Once | OROMUCOSAL | Status: AC
  Administered 2020-04-01: 15 mL via ORAL
  Filled 2020-04-01: qty 15

## 2020-04-01 MED ORDER — ALUM & MAG HYDROXIDE-SIMETH 200-200-20 MG/5ML PO SUSP
30.0000 mL | Freq: Once | ORAL | Status: AC
  Administered 2020-04-01: 30 mL via ORAL
  Filled 2020-04-01: qty 30

## 2020-04-01 MED ORDER — LORAZEPAM 1 MG PO TABS
0.5000 mg | ORAL_TABLET | Freq: Once | ORAL | Status: AC
  Administered 2020-04-01: 0.5 mg via ORAL
  Filled 2020-04-01: qty 1

## 2020-04-01 NOTE — ED Provider Notes (Signed)
[facility] EMERGENCY DEPARTMENT Provider Note   CSN: [id number] Arrival date & time: 04/01/20  1535     History Chief Complaint  Patient presents with  . Cough  . Sore Throat    Melissa Harris is a 86 y.o. female.  HPI   86 year old female with sore throat.  She reports that the symptoms have been going on for "a long time."  She states that she has "a partial blockage."  Endorses a mild sore throat.  No coughing.  Sore throat.  No fevers or chills.  She reports compliance with her medications.  Sometimes sensation of food sticking.  She has been able to eat and drink though.  Past Medical History:  Diagnosis Date  . Carpal tunnel syndrome    left   . Depression   . GERD (gastroesophageal reflux disease)   . Hypertension   . Nocturia   . Osteoarthritis     Patient Active Problem List   Diagnosis Date Noted  . OA (osteoarthritis) of hip 12/15/2016  . OA (osteoarthritis) of knee 12/20/2014  . Preop cardiovascular exam 11/28/2014  . Palpitations 11/28/2014    Past Surgical History:  Procedure Laterality Date  . APPENDECTOMY    . CATARACT EXTRACTION    . CHOLECYSTECTOMY    . TONSILLECTOMY AND ADENOIDECTOMY    . TOTAL HIP ARTHROPLASTY Right   . TOTAL HIP ARTHROPLASTY Left 12/15/2016   Procedure: LEFT TOTAL HIP ARTHROPLASTY ANTERIOR APPROACH;  Surgeon: Ollen Gross, MD;  Location: [facility];  Service: Orthopedics;  Laterality: Left;  requests  . TOTAL KNEE ARTHROPLASTY Right   . TOTAL KNEE ARTHROPLASTY Left 12/20/2014   Procedure: LEFT TOTAL KNEE ARTHROPLASTY;  Surgeon: Ollen Gross, MD;  Location: [facility];  Service: Orthopedics;  Laterality: Left;     OB History   No obstetric history on file.     Family History  Problem Relation Age of Onset  . Dementia Other        Died age 40    Social History   Tobacco Use  . Smoking status: Never Smoker  . Smokeless tobacco: Never Used  Substance Use Topics  . Alcohol use: No    Alcohol/week: 0.0  standard drinks  . Drug use: No    Home Medications Prior to Admission medications   Medication Sig Start Date End Date Taking? Authorizing Provider  acetaminophen (TYLENOL) 325 MG tablet Take 2 tablets (650 mg total) by mouth every 6 (six) hours as needed for mild pain (or Fever >/= 101). Patient taking differently: Take 325 mg by mouth every 6 (six) hours as needed for mild pain (or Fever >/= 101).  12/23/14   Perkins, Alexzandrew L, PA-C  bisacodyl (DULCOLAX) 10 MG suppository Place 1 suppository (10 mg total) rectally daily as needed for moderate constipation. Patient not taking: Reported on 12/03/2016 12/23/14   Julien Girt, Alexzandrew L, PA-C  docusate sodium (COLACE) 100 MG capsule Take 1 capsule (100 mg total) by mouth 2 (two) times daily. Patient not taking: Reported on 12/03/2016 12/23/14   Julien Girt, Alexzandrew L, PA-C  latanoprost (XALATAN) 0.005 % ophthalmic solution Place 1 drop into both eyes at bedtime.     [provider]  methocarbamol (ROBAXIN) 500 MG tablet Take 1 tablet (500 mg total) by mouth every 6 (six) hours as needed for muscle spasms. Patient not taking: Reported on 04/28/2018 12/17/16   Julien Girt, Alexzandrew L, PA-C  metoprolol tartrate (LOPRESSOR) 25 MG tablet Take 25-50 mg by mouth See admin instructions.  1 in the morning and two in the evening     [provider]  omeprazole (PRILOSEC) 40 MG capsule Take 1 capsule (40 mg total) by mouth daily. 02/11/20   Garald Balding, PA-C  rivaroxaban (XARELTO) 10 MG TABS tablet Take 1 tablet (10 mg total) by mouth daily with breakfast. Take Xarelto for two and a half more weeks following discharge from the hospital, then discontinue Xarelto. Once the patient has completed the blood thinner regimen, then take a Baby 81 mg Aspirin daily for three more weeks. Patient not taking: Reported on 04/28/2018 12/18/16   Dara Lords, Alexzandrew L, PA-C  simethicone (MYLICON) 80 MG chewable tablet Chew 1 tablet (80 mg total) by mouth every  6 (six) hours as needed for flatulence. Patient not taking: Reported on 04/28/2018 11/24/17   Rogue Bussing, MD  sucralfate (CARAFATE) 1 g tablet Take 1 tablet (1 g total) by mouth 4 (four) times daily -  with meals and at bedtime. 01/07/20   Varney Biles, MD  timolol (BETIMOL) 0.5 % ophthalmic solution Place 1 drop into the right eye daily.    [provider]  traMADol (ULTRAM) 50 MG tablet Take 1-2 tablets (50-100 mg total) by mouth every 6 (six) hours as needed for moderate pain. Patient not taking: Reported on 04/28/2018 12/17/16   Dara Lords, Alexzandrew L, PA-C    Allergies    Patient has no known allergies.  Review of Systems   Review of Systems All systems reviewed and negative, other than as noted in HPI.  Physical Exam Updated Vital Signs BP (!) 158/72 (BP Location: Right Arm)   Pulse 82   Temp 98.2 F (36.8 C) (Oral)   Resp 16   SpO2 95%   Physical Exam Vitals and nursing note reviewed.  Constitutional:      General: She is not in acute distress.    Appearance: She is well-developed.     Comments: Sitting in bed.  No acute distress.  Occasionally clearing throat.  Probably some degree of dementia.  She will sometimes repeat herself and seems to lose her train of thought easily.  HENT:     Head: Normocephalic and atraumatic.     Mouth/Throat:     Mouth: Mucous membranes are moist.     Pharynx: Oropharynx is clear.     Comments: Oropharynx is clear.  Neck is supple.  Nontender.  No stridor. Eyes:     General:        Right eye: No discharge.        Left eye: No discharge.     Conjunctiva/sclera: Conjunctivae normal.  Cardiovascular:     Rate and Rhythm: Normal rate and regular rhythm.     Heart sounds: Normal heart sounds. No murmur heard.  No friction rub. No gallop.   Pulmonary:     Effort: Pulmonary effort is normal. No respiratory distress.     Breath sounds: Normal breath sounds.  Abdominal:     General: There is no distension.      Palpations: Abdomen is soft.     Tenderness: There is no abdominal tenderness.  Musculoskeletal:        General: No tenderness.     Cervical back: Neck supple.  Skin:    General: Skin is warm and dry.  Neurological:     Mental Status: She is alert.  Psychiatric:        Behavior: Behavior normal.        Thought Content: Thought content normal.  ED Results / Procedures / Treatments   Labs (all labs ordered are listed, but only abnormal results are displayed) Labs Reviewed - No data to display  EKG None  Radiology No results found.  Procedures Procedures (including critical care time)  Medications Ordered in ED Medications  alum & mag hydroxide-simeth (MAALOX/MYLANTA) 200-200-20 MG/5ML suspension 30 mL (has no administration in time range)    And  lidocaine (XYLOCAINE) 2 % viscous mouth solution 15 mL (has no administration in time range)  LORazepam (ATIVAN) tablet 0.5 mg (has no administration in time range)    ED Course  I have reviewed the triage vital signs and the nursing notes.  Pertinent labs & imaging results that were available during my care of the patient were reviewed by me and considered in my medical decision making (see chart for details).    MDM Rules/Calculators/A&P                          86 year old female with sore throat.  Her physical exam is pretty unremarkable aside from occasionally clearing her throat.  She reports that she is able to eat/drink both solids and liquids.  Per review of records, this seems to be a chronic issue.  Notes going back over a year mentioning globus sensation and esophageal dysmotility.  Also mention in the some notes that persistent throat clearing may be related to anxiety.  She has omeprazole on her medication list.  Objectively I am not seeing any signs of a food impaction or airway compromise.  She is speaking normally.  Lungs are clear.  There is no stridor.  Seems to be a chronic issue.  She was given a small dose  of Ativan and a GI cocktail here in the emergency room.  Outpt GI follows seems to be the most appropriate course of action at this point.  Final Clinical Impression(s) / ED Diagnoses Final diagnoses:  Globus sensation    Rx / DC Orders ED Discharge Orders    None       Raeford Razor, MD 04/04/20 2304

## 2020-04-01 NOTE — ED Notes (Signed)
Pt sitting in chair wating on PTAR, given crackers and ginger ale up walking with cane , pt  Asking about her wallet , which she did not bring to the hospital with her asking about her daughter coming to get her to take her home

## 2020-04-01 NOTE — Discharge Instructions (Addendum)
Follow-up with your gastroenterologist (GI).

## 2020-04-01 NOTE — ED Notes (Signed)
[facility] called to take pt back to [facility]  Nursing independent living

## 2020-04-01 NOTE — ED Notes (Signed)
PTAR here  To get pt and take her bck to [facility]

## 2020-04-01 NOTE — ED Triage Notes (Signed)
Pt has hx of throat blockage and GERD has a partial blockage per hx and ems , pt states her throat has been sore and she is burping a lot , per ems pt lives in independent and may need increased assistience

## 2020-04-01 NOTE — ED Notes (Addendum)
Called [facility] , phone rang and rang no one answered , I left message that we were sending pt home via [facility] back [place], told them daughter was aware also

## 2020-04-01 NOTE — ED Notes (Signed)
Pt states that she feels like she has to pee a lot , up to BR but missed hat all together,

## 2020-04-01 NOTE — ED Notes (Signed)
Daughter notified that pt ws going hoome

## 2020-04-22 ENCOUNTER — Emergency Department (HOSPITAL_COMMUNITY): Payer: Medicare Other

## 2020-04-22 ENCOUNTER — Encounter (HOSPITAL_COMMUNITY): Payer: Self-pay

## 2020-04-22 ENCOUNTER — Other Ambulatory Visit: Payer: Self-pay

## 2020-04-22 ENCOUNTER — Emergency Department (HOSPITAL_COMMUNITY)
Admission: EM | Admit: 2020-04-22 | Discharge: 2020-04-23 | Disposition: A | Discharge status: Home / Self Care | Payer: Medicare Other | Attending: Emergency Medicine | Admitting: Emergency Medicine

## 2020-04-22 DIAGNOSIS — R05 Cough: Secondary | ICD-10-CM | POA: Insufficient documentation

## 2020-04-22 DIAGNOSIS — I10 Essential (primary) hypertension: Secondary | ICD-10-CM | POA: Diagnosis not present

## 2020-04-22 DIAGNOSIS — Z96643 Presence of artificial hip joint, bilateral: Secondary | ICD-10-CM | POA: Diagnosis not present

## 2020-04-22 DIAGNOSIS — F419 Anxiety disorder, unspecified: Secondary | ICD-10-CM | POA: Insufficient documentation

## 2020-04-22 DIAGNOSIS — Z79899 Other long term (current) drug therapy: Secondary | ICD-10-CM | POA: Diagnosis not present

## 2020-04-22 DIAGNOSIS — R1310 Dysphagia, unspecified: Secondary | ICD-10-CM

## 2020-04-22 DIAGNOSIS — R109 Unspecified abdominal pain: Secondary | ICD-10-CM | POA: Diagnosis not present

## 2020-04-22 DIAGNOSIS — F0390 Unspecified dementia without behavioral disturbance: Secondary | ICD-10-CM | POA: Diagnosis not present

## 2020-04-22 DIAGNOSIS — R059 Cough, unspecified: Secondary | ICD-10-CM

## 2020-04-22 DIAGNOSIS — Z96653 Presence of artificial knee joint, bilateral: Secondary | ICD-10-CM | POA: Insufficient documentation

## 2020-04-22 LAB — CBC WITH DIFFERENTIAL/PLATELET
Abs Immature Granulocytes: 0.06 10*3/uL (ref 0.00–0.07)
Basophils Absolute: 0.1 10*3/uL (ref 0.0–0.1)
Basophils Relative: 1 %
Eosinophils Absolute: 0.3 10*3/uL (ref 0.0–0.5)
Eosinophils Relative: 3 %
HCT: 41.3 % (ref 36.0–46.0)
Hemoglobin: 13.6 g/dL (ref 12.0–15.0)
Immature Granulocytes: 1 %
Lymphocytes Relative: 19 %
Lymphs Abs: 1.8 10*3/uL (ref 0.7–4.0)
MCH: 31 pg (ref 26.0–34.0)
MCHC: 32.9 g/dL (ref 30.0–36.0)
MCV: 94.1 fL (ref 80.0–100.0)
Monocytes Absolute: 0.8 10*3/uL (ref 0.1–1.0)
Monocytes Relative: 9 %
Neutro Abs: 6.3 10*3/uL (ref 1.7–7.7)
Neutrophils Relative %: 67 %
Platelets: 243 10*3/uL (ref 150–400)
RBC: 4.39 MIL/uL (ref 3.87–5.11)
RDW: 12.1 % (ref 11.5–15.5)
WBC: 9.3 10*3/uL (ref 4.0–10.5)
nRBC: 0 % (ref 0.0–0.2)

## 2020-04-22 LAB — COMPREHENSIVE METABOLIC PANEL
ALT: 10 U/L (ref 0–44)
AST: 12 U/L — ABNORMAL LOW (ref 15–41)
Albumin: 3.9 g/dL (ref 3.5–5.0)
Alkaline Phosphatase: 88 U/L (ref 38–126)
Anion gap: 9 (ref 5–15)
BUN: 16 mg/dL (ref 8–23)
CO2: 29 mmol/L (ref 22–32)
Calcium: 9.6 mg/dL (ref 8.9–10.3)
Chloride: 99 mmol/L (ref 98–111)
Creatinine, Ser: 0.85 mg/dL (ref 0.44–1.00)
GFR calc Af Amer: >60 mL/min (ref >60)
GFR calc non Af Amer: >60 mL/min (ref >60)
Glucose, Bld: 97 mg/dL (ref 70–99)
Potassium: 3.9 mmol/L (ref 3.5–5.1)
Sodium: 137 mmol/L (ref 135–145)
Total Bilirubin: 0.8 mg/dL (ref 0.3–1.2)
Total Protein: 7.2 g/dL (ref 6.5–8.1)

## 2020-04-22 LAB — LIPASE, BLOOD: Lipase: 46 U/L (ref 11–51)

## 2020-04-22 MED ORDER — LORAZEPAM 2 MG/ML IJ SOLN
0.5000 mg | Freq: Once | INTRAMUSCULAR | Status: AC
  Administered 2020-04-22: 0.5 mg via INTRAVENOUS
  Filled 2020-04-22: qty 1

## 2020-04-22 MED ORDER — METOCLOPRAMIDE HCL 5 MG/ML IJ SOLN
2.5000 mg | Freq: Once | INTRAMUSCULAR | Status: AC
  Administered 2020-04-22: 2.5 mg via INTRAVENOUS
  Filled 2020-04-22: qty 2

## 2020-04-22 NOTE — ED Triage Notes (Addendum)
Pt arrived via EMS for swallowing difficulty, states she has a hx of esophageal spasms. Ate dinner approx 1 hr PTA and states she feels "something stuck". Pt able to speak in full sentences, no drooling, spo2 95% on RA

## 2020-04-22 NOTE — ED Notes (Signed)
Attempted to call facility x2, no answer.

## 2020-04-22 NOTE — ED Notes (Signed)
Patient transported to X-ray 

## 2020-04-22 NOTE — ED Provider Notes (Signed)
[facility]   CSN: [id number] Arrival date & time: 04/22/20  1844     History Chief Complaint  Patient presents with  . Dysphagia    Melissa Harris is a 86 y.o. female.  86 year old female who presents with trouble swallowing for very long time.  Review the old chart shows that patient has been seen for this multiple times.  Does have some esophageal dysmotility.  Also has some component of anxiety.  Patient is able to swallow liquids and eat foods without emesis.  She will take her medications.  Patient does have some history of dementia and states that her symptoms have been getting worse over several weeks.  No treatment use prior to arrival        Past Medical History:  Diagnosis Date  . Carpal tunnel syndrome    left   . Depression   . GERD (gastroesophageal reflux disease)   . Hypertension   . Nocturia   . Osteoarthritis     Patient Active Problem List   Diagnosis Date Noted  . OA (osteoarthritis) of hip 12/15/2016  . OA (osteoarthritis) of knee 12/20/2014  . Preop cardiovascular exam 11/28/2014  . Palpitations 11/28/2014    Past Surgical History:  Procedure Laterality Date  . APPENDECTOMY    . CATARACT EXTRACTION    . CHOLECYSTECTOMY    . TONSILLECTOMY AND ADENOIDECTOMY    . TOTAL HIP ARTHROPLASTY Right   . TOTAL HIP ARTHROPLASTY Left 12/15/2016   Procedure: LEFT TOTAL HIP ARTHROPLASTY ANTERIOR APPROACH;  Surgeon: Ollen Gross, MD;  Location: [facility];  Service: Orthopedics;  Laterality: Left;  requests  . TOTAL KNEE ARTHROPLASTY Right   . TOTAL KNEE ARTHROPLASTY Left 12/20/2014   Procedure: LEFT TOTAL KNEE ARTHROPLASTY;  Surgeon: Ollen Gross, MD;  Location: [facility];  Service: Orthopedics;  Laterality: Left;     OB History   No obstetric history on file.     Family History  Problem Relation Age of Onset  . Dementia Other        Died age 63    Social History   Tobacco Use  . Smoking  status: Never Smoker  . Smokeless tobacco: Never Used  Substance Use Topics  . Alcohol use: No    Alcohol/week: 0.0 standard drinks  . Drug use: No    Home Medications Prior to Admission medications   Medication Sig Start Date End Date Taking? Authorizing Provider  acetaminophen (TYLENOL) 325 MG tablet Take 2 tablets (650 mg total) by mouth every 6 (six) hours as needed for mild pain (or Fever >/= 101). Patient taking differently: Take 325 mg by mouth every 6 (six) hours as needed for mild pain (or Fever >/= 101).  12/23/14   Perkins, Alexzandrew L, PA-C  bisacodyl (DULCOLAX) 10 MG suppository Place 1 suppository (10 mg total) rectally daily as needed for moderate constipation. Patient not taking: Reported on 12/03/2016 12/23/14   Julien Girt, Alexzandrew L, PA-C  docusate sodium (COLACE) 100 MG capsule Take 1 capsule (100 mg total) by mouth 2 (two) times daily. Patient not taking: Reported on 12/03/2016 12/23/14   Julien Girt, Alexzandrew L, PA-C  latanoprost (XALATAN) 0.005 % ophthalmic solution Place 1 drop into both eyes at bedtime.     [provider]  methocarbamol (ROBAXIN) 500 MG tablet Take 1 tablet (500 mg total) by mouth every 6 (six) hours as needed for muscle spasms. Patient not taking: Reported on 04/28/2018 12/17/16   Lauraine Rinne, PA-C  metoprolol tartrate (LOPRESSOR) 25 MG tablet Take 25-50 mg by mouth See admin instructions. 1 in the morning and two in the evening     [provider]  omeprazole (PRILOSEC) 40 MG capsule Take 1 capsule (40 mg total) by mouth daily. 02/11/20   Mare Ferrari, PA-C  rivaroxaban (XARELTO) 10 MG TABS tablet Take 1 tablet (10 mg total) by mouth daily with breakfast. Take Xarelto for two and a half more weeks following discharge from the hospital, then discontinue Xarelto. Once the patient has completed the blood thinner regimen, then take a Baby 81 mg Aspirin daily for three more weeks. Patient not taking: Reported on 04/28/2018 12/18/16    Julien Girt, Alexzandrew L, PA-C  simethicone (MYLICON) 80 MG chewable tablet Chew 1 tablet (80 mg total) by mouth every 6 (six) hours as needed for flatulence. Patient not taking: Reported on 04/28/2018 11/24/17   Casey Burkitt, MD  sucralfate (CARAFATE) 1 g tablet Take 1 tablet (1 g total) by mouth 4 (four) times daily -  with meals and at bedtime. 01/07/20   Derwood Kaplan, MD  timolol (BETIMOL) 0.5 % ophthalmic solution Place 1 drop into the right eye daily.    [provider]  traMADol (ULTRAM) 50 MG tablet Take 1-2 tablets (50-100 mg total) by mouth every 6 (six) hours as needed for moderate pain. Patient not taking: Reported on 04/28/2018 12/17/16   Julien Girt, Alexzandrew L, PA-C    Allergies    Patient has no known allergies.  Review of Systems   Review of Systems  All other systems reviewed and are negative.   Physical Exam Updated Vital Signs BP (!) 164/86 (BP Location: Left Arm)   Pulse 67   Temp 99.9 F (37.7 C) (Oral)   Resp 16   Ht 1.651 m (5\' 5" )   Wt 72.6 kg   SpO2 94%   BMI 26.63 kg/m   Physical Exam Vitals and nursing note reviewed.  Constitutional:      General: She is not in acute distress.    Appearance: Normal appearance. She is well-developed. She is not toxic-appearing.  HENT:     Head: Normocephalic and atraumatic.  Eyes:     General: Lids are normal.     Conjunctiva/sclera: Conjunctivae normal.     Pupils: Pupils are equal, round, and reactive to light.  Neck:     Thyroid: No thyroid mass.     Trachea: No tracheal deviation.  Cardiovascular:     Rate and Rhythm: Normal rate and regular rhythm.     Heart sounds: Normal heart sounds. No murmur heard.  No gallop.   Pulmonary:     Effort: Pulmonary effort is normal. No respiratory distress.     Breath sounds: Normal breath sounds. No stridor. No decreased breath sounds, wheezing, rhonchi or rales.  Abdominal:     General: Bowel sounds are normal. There is no distension.      Palpations: Abdomen is soft.     Tenderness: There is no abdominal tenderness. There is no rebound.  Musculoskeletal:        General: No tenderness. Normal range of motion.     Cervical back: Normal range of motion and neck supple.  Skin:    General: Skin is warm and dry.     Findings: No abrasion or rash.  Neurological:     Mental Status: She is alert and oriented to person, place, and time.     GCS: GCS eye subscore is 4. GCS verbal  subscore is 5. GCS motor subscore is 6.     Cranial Nerves: No cranial nerve deficit.     Sensory: No sensory deficit.  Psychiatric:        Speech: Speech normal.        Behavior: Behavior normal.     ED Results / Procedures / Treatments   Labs (all labs ordered are listed, but only abnormal results are displayed) Labs Reviewed - No data to display  EKG None  Radiology No results found.  Procedures Procedures (including critical care time)  Medications Ordered in ED Medications - No data to display  ED Course  I have reviewed the triage vital signs and the nursing notes.  Pertinent labs & imaging results that were available during my care of the patient were reviewed by me and considered in my medical decision making (see chart for details).    MDM Rules/Calculators/A&P                          Patient given Ativan along with Reglan due to anxiety as well as abdominal discomfort.  Her abdominal CT along with labs which were negative.  Feels better this time and will discharge Final Clinical Impression(s) / ED Diagnoses Final diagnoses:  Cough    Rx / DC Orders ED Discharge Orders    None       Lorre Nick, MD 04/22/20 2254

## 2020-04-23 NOTE — ED Notes (Signed)
[facility] at bedside to pick up patient.   

## 2020-04-23 NOTE — ED Notes (Signed)
[facility] called for transport.  

## 2021-01-06 ENCOUNTER — Emergency Department (HOSPITAL_COMMUNITY): Payer: Medicare Other

## 2021-01-06 ENCOUNTER — Encounter (HOSPITAL_COMMUNITY): Payer: Self-pay | Admitting: Emergency Medicine

## 2021-01-06 ENCOUNTER — Other Ambulatory Visit: Payer: Self-pay

## 2021-01-06 ENCOUNTER — Emergency Department (HOSPITAL_COMMUNITY)
Admission: EM | Admit: 2021-01-06 | Discharge: 2021-01-06 | Disposition: A | Discharge status: Home / Self Care | Payer: Medicare Other | Attending: Emergency Medicine | Admitting: Emergency Medicine

## 2021-01-06 DIAGNOSIS — Z23 Encounter for immunization: Secondary | ICD-10-CM | POA: Diagnosis not present

## 2021-01-06 DIAGNOSIS — M25511 Pain in right shoulder: Secondary | ICD-10-CM | POA: Diagnosis not present

## 2021-01-06 DIAGNOSIS — Z96643 Presence of artificial hip joint, bilateral: Secondary | ICD-10-CM | POA: Insufficient documentation

## 2021-01-06 DIAGNOSIS — Z79899 Other long term (current) drug therapy: Secondary | ICD-10-CM | POA: Diagnosis not present

## 2021-01-06 DIAGNOSIS — S0990XA Unspecified injury of head, initial encounter: Secondary | ICD-10-CM | POA: Diagnosis present

## 2021-01-06 DIAGNOSIS — F0390 Unspecified dementia without behavioral disturbance: Secondary | ICD-10-CM | POA: Insufficient documentation

## 2021-01-06 DIAGNOSIS — Z7901 Long term (current) use of anticoagulants: Secondary | ICD-10-CM | POA: Insufficient documentation

## 2021-01-06 DIAGNOSIS — I10 Essential (primary) hypertension: Secondary | ICD-10-CM | POA: Insufficient documentation

## 2021-01-06 DIAGNOSIS — Y9301 Activity, walking, marching and hiking: Secondary | ICD-10-CM | POA: Insufficient documentation

## 2021-01-06 DIAGNOSIS — S0101XA Laceration without foreign body of scalp, initial encounter: Secondary | ICD-10-CM | POA: Insufficient documentation

## 2021-01-06 DIAGNOSIS — W19XXXA Unspecified fall, initial encounter: Secondary | ICD-10-CM

## 2021-01-06 DIAGNOSIS — W01198A Fall on same level from slipping, tripping and stumbling with subsequent striking against other object, initial encounter: Secondary | ICD-10-CM | POA: Diagnosis not present

## 2021-01-06 DIAGNOSIS — Y92129 Unspecified place in nursing home as the place of occurrence of the external cause: Secondary | ICD-10-CM | POA: Diagnosis not present

## 2021-01-06 DIAGNOSIS — Z96653 Presence of artificial knee joint, bilateral: Secondary | ICD-10-CM | POA: Insufficient documentation

## 2021-01-06 DIAGNOSIS — M25512 Pain in left shoulder: Secondary | ICD-10-CM | POA: Diagnosis not present

## 2021-01-06 MED ORDER — LIDOCAINE-EPINEPHRINE-TETRACAINE (LET) TOPICAL GEL
3.0000 mL | Freq: Once | TOPICAL | Status: AC
  Administered 2021-01-06: 3 mL via TOPICAL
  Filled 2021-01-06: qty 3

## 2021-01-06 MED ORDER — TETANUS-DIPHTH-ACELL PERTUSSIS 5-2.5-18.5 LF-MCG/0.5 IM SUSY
0.5000 mL | PREFILLED_SYRINGE | Freq: Once | INTRAMUSCULAR | Status: AC
  Administered 2021-01-06: 0.5 mL via INTRAMUSCULAR
  Filled 2021-01-06: qty 0.5

## 2021-01-06 MED ORDER — ACETAMINOPHEN 325 MG PO TABS
650.0000 mg | ORAL_TABLET | Freq: Once | ORAL | Status: AC
  Administered 2021-01-06: 650 mg via ORAL
  Filled 2021-01-06: qty 2

## 2021-01-06 NOTE — ED Provider Notes (Addendum)
[facility] Provider Note   CSN: [id number] Arrival date & time: 01/06/21  1714     History Chief Complaint  Patient presents with  . Fall    Melissa Harris is a 87 y.o. female.  Patient is a 87 year old female who presents after a fall.  She currently lives at [facility] facility.  Per the staff there, she was walking down the dining room and lost her balance, falling backward hitting her head.  There was no reported loss of consciousness.  She otherwise has been well-appearing with no recent illnesses.  She has some baseline dementia but they have not noticed a change in her mental status.  Of note she is on Xarelto.  She is not currently complaining of pain other than she says she has arthritis in all her joints hurt which is normal for her.        Past Medical History:  Diagnosis Date  . Carpal tunnel syndrome    left   . Depression   . GERD (gastroesophageal reflux disease)   . Hypertension   . Nocturia   . Osteoarthritis     Patient Active Problem List   Diagnosis Date Noted  . OA (osteoarthritis) of hip 12/15/2016  . OA (osteoarthritis) of knee 12/20/2014  . Preop cardiovascular exam 11/28/2014  . Palpitations 11/28/2014    Past Surgical History:  Procedure Laterality Date  . APPENDECTOMY    . CATARACT EXTRACTION    . CHOLECYSTECTOMY    . TONSILLECTOMY AND ADENOIDECTOMY    . TOTAL HIP ARTHROPLASTY Right   . TOTAL HIP ARTHROPLASTY Left 12/15/2016   Procedure: LEFT TOTAL HIP ARTHROPLASTY ANTERIOR APPROACH;  Surgeon: Ollen Gross, MD;  Location: [facility];  Service: Orthopedics;  Laterality: Left;  requests  . TOTAL KNEE ARTHROPLASTY Right   . TOTAL KNEE ARTHROPLASTY Left 12/20/2014   Procedure: LEFT TOTAL KNEE ARTHROPLASTY;  Surgeon: Ollen Gross, MD;  Location: [facility];  Service: Orthopedics;  Laterality: Left;     OB History   No obstetric history on file.     Family History  Problem Relation Age of  Onset  . Dementia Other        Died age 43    Social History   Tobacco Use  . Smoking status: Never Smoker  . Smokeless tobacco: Never Used  Substance Use Topics  . Alcohol use: No    Alcohol/week: 0.0 standard drinks  . Drug use: No    Home Medications Prior to Admission medications   Medication Sig Start Date End Date Taking? Authorizing Provider  acetaminophen (TYLENOL) 500 MG tablet Take 1,000 mg by mouth 2 (two) times daily.   Yes [provider]  divalproex (DEPAKOTE) 125 MG DR tablet Take 125 mg by mouth 2 (two) times daily.   Yes [provider]  magnesium hydroxide (MILK OF MAGNESIA) 400 MG/5ML suspension Take 30 mLs by mouth every 6 (six) hours as needed for mild constipation.   Yes [provider]  memantine (NAMENDA) 5 MG tablet Take 5 mg by mouth 2 (two) times daily.   Yes [provider]  metoprolol tartrate (LOPRESSOR) 25 MG tablet Take 25-50 mg by mouth See admin instructions. Take 25 mg in the morning and 50 mg in the evening   Yes [provider]  pantoprazole (PROTONIX) 40 MG tablet Take 40 mg by mouth 2 (two) times daily.   Yes [provider]  sertraline (ZOLOFT) 50 MG tablet Take 50 mg  by mouth daily.   Yes [provider]  acetaminophen (TYLENOL) 325 MG tablet Take 2 tablets (650 mg total) by mouth every 6 (six) hours as needed for mild pain (or Fever >/= 101). Patient not taking: No sig reported 12/23/14   Julien Girt, Alexzandrew L, PA-C  bisacodyl (DULCOLAX) 10 MG suppository Place 1 suppository (10 mg total) rectally daily as needed for moderate constipation. Patient not taking: No sig reported 12/23/14   Julien Girt, Alexzandrew L, PA-C  docusate sodium (COLACE) 100 MG capsule Take 1 capsule (100 mg total) by mouth 2 (two) times daily. Patient not taking: No sig reported 12/23/14   Julien Girt, Alexzandrew L, PA-C  methocarbamol (ROBAXIN) 500 MG tablet Take 1 tablet (500 mg total) by mouth every 6 (six) hours  as needed for muscle spasms. Patient not taking: No sig reported 12/17/16   Julien Girt, Alexzandrew L, PA-C  omeprazole (PRILOSEC) 40 MG capsule Take 1 capsule (40 mg total) by mouth daily. Patient not taking: Reported on 01/06/2021 02/11/20   Mare Ferrari, PA-C  rivaroxaban (XARELTO) 10 MG TABS tablet Take 1 tablet (10 mg total) by mouth daily with breakfast. Take Xarelto for two and a half more weeks following discharge from the hospital, then discontinue Xarelto. Once the patient has completed the blood thinner regimen, then take a Baby 81 mg Aspirin daily for three more weeks. Patient not taking: No sig reported 12/18/16   Julien Girt, Alexzandrew L, PA-C  simethicone (MYLICON) 80 MG chewable tablet Chew 1 tablet (80 mg total) by mouth every 6 (six) hours as needed for flatulence. Patient not taking: No sig reported 11/24/17   Casey Burkitt, MD  sucralfate (CARAFATE) 1 g tablet Take 1 tablet (1 g total) by mouth 4 (four) times daily -  with meals and at bedtime. Patient not taking: Reported on 01/06/2021 01/07/20   Derwood Kaplan, MD  traMADol (ULTRAM) 50 MG tablet Take 1-2 tablets (50-100 mg total) by mouth every 6 (six) hours as needed for moderate pain. Patient not taking: No sig reported 12/17/16   Julien Girt, Alexzandrew L, PA-C    Allergies    Patient has no known allergies.  Review of Systems   Review of Systems  Unable to perform ROS: Dementia    Physical Exam Updated Vital Signs BP (!) 150/108   Pulse 88   Temp 98.2 F (36.8 C) (Oral)   Resp 17   SpO2 96%   Physical Exam Constitutional:      Appearance: She is well-developed.  HENT:     Head: Normocephalic.     Comments: 2 cm laceration to the posterior occipital scalp with some minor oozing Eyes:     Pupils: Pupils are equal, round, and reactive to light.  Neck:     Comments: Towel roll in place for neck support.  She does have some tenderness on palpation of the cervical spine.  No pain on palpation of the thoracic  or lumbosacral spine, no step-offs or deformities Cardiovascular:     Rate and Rhythm: Normal rate and regular rhythm.     Heart sounds: Normal heart sounds.  Pulmonary:     Effort: Pulmonary effort is normal. No respiratory distress.     Breath sounds: Normal breath sounds. No wheezing or rales.  Chest:     Chest wall: No tenderness.  Abdominal:     General: Bowel sounds are normal.     Palpations: Abdomen is soft.     Tenderness: There is no abdominal tenderness. There is no  guarding or rebound.  Musculoskeletal:        General: Normal range of motion.     Cervical back: Normal range of motion and neck supple.     Comments: Positive pain on range of motion of bilateral shoulders without noted deformity.  No pain to the elbows or wrist.  No pain on palpation or range of motion of the lower extremities, including the hips, distal pulses intact.  Lymphadenopathy:     Cervical: No cervical adenopathy.  Skin:    General: Skin is warm and dry.     Findings: No rash.  Neurological:     Mental Status: She is alert.     Comments: Patient is oriented to person and place, confused to the month and year, no focal deficits noted     ED Results / Procedures / Treatments   Labs (all labs ordered are listed, but only abnormal results are displayed) Labs Reviewed - No data to display  EKG None  Radiology DG Shoulder Right  Result Date: 01/06/2021 CLINICAL DATA:  Fall with shoulder pain EXAM: RIGHT SHOULDER - 2+ VIEW COMPARISON:  None. FINDINGS: No acute displaced fracture or malalignment. Advanced glenohumeral degenerative change. Moderate AC joint degenerative change. Calcific tendinitis IMPRESSION: No acute osseous abnormality. Electronically Signed   By: Jasmine PangKim  Fujinaga M.D.   On: 01/06/2021 18:30   CT Head Wo Contrast  Result Date: 01/06/2021 CLINICAL DATA:  Fall, pain in the head. EXAM: CT HEAD WITHOUT CONTRAST TECHNIQUE: Contiguous axial images were obtained from the base of the skull  through the vertex without intravenous contrast. COMPARISON:  None. FINDINGS: Brain: The brainstem, cerebellum, cerebral peduncles, thalami, basal ganglia, basilar cisterns, and ventricular system appear within normal limits. No intracranial hemorrhage, mass lesion, or acute CVA. Periventricular white matter and corona radiata hypodensities favor chronic ischemic microvascular white matter disease. Vascular: There is atherosclerotic calcification of the cavernous carotid arteries bilaterally. Skull: Unremarkable Sinuses/Orbits: Unremarkable Other: Right maxillary tooth decay noted. IMPRESSION: 1. No acute intracranial findings. 2. Periventricular white matter and corona radiata hypodensities favor chronic ischemic microvascular white matter disease. 3. Right maxillary tooth decay noted. Electronically Signed   By: Gaylyn RongWalter  Liebkemann M.D.   On: 01/06/2021 19:04   CT Cervical Spine Wo Contrast  Result Date: 01/06/2021 CLINICAL DATA:  Fall, neck pain. EXAM: CT CERVICAL SPINE WITHOUT CONTRAST TECHNIQUE: Multidetector CT imaging of the cervical spine was performed without intravenous contrast. Multiplanar CT image reconstructions were also generated. COMPARISON:  Neck radiographs from 01/07/2020 FINDINGS: Alignment: 2 mm of chronic degenerative anterolisthesis at C5-6, C7-T1, and T1-2. Skull base and vertebrae: Calcified pannus posterior to the odontoid. No fracture or acute bony findings. Soft tissues and spinal canal: Atherosclerotic calcification of the aortic arch and branch vessels. Probable sebaceous cyst along the left posterior lower neck on image 52 series 4 in the subcutaneous tissues. Disc levels: Right foraminal impingement at C4-5, C5-6, C7-T1, T1-2, and T2-3 due to intervertebral and facet spurring. Left foraminal stenosis at C4-5, C5-6, and C7-T1 likewise due to spurring. There is likely mild central narrowing of the thecal sac at C4-5 due to calcified disc bulge. Upper chest: Degenerative  sternoclavicular arthropathy, right greater than left. Other: No supplemental non-categorized findings. IMPRESSION: 1. No acute cervical spine findings. 2. Multilevel impingement due due to spurring. 3. Calcified pannus posterior to the odontoid. 4. Atherosclerosis. Electronically Signed   By: Gaylyn RongWalter  Liebkemann M.D.   On: 01/06/2021 19:10   DG Shoulder Left  Result Date: 01/06/2021 CLINICAL DATA:  Fall with shoulder pain EXAM: LEFT SHOULDER - 2+ VIEW COMPARISON:  None. FINDINGS: No fracture or malalignment. Moderate AC joint degenerative change. Advanced glenohumeral degenerative change. IMPRESSION: No acute osseous abnormality. Electronically Signed   By: Jasmine Pang M.D.   On: 01/06/2021 18:31    Procedures .Marland KitchenLaceration Repair  Date/Time: 01/06/2021 9:12 PM Performed by: Rolan Bucco, MD Authorized by: Rolan Bucco, MD   Consent:    Consent obtained: Implied consent, patient has dementia. Universal protocol:    Procedure explained and questions answered to patient or proxy's satisfaction: yes     Test results available: yes   Anesthesia:    Anesthesia method:  Topical application   Topical anesthetic:  LET Laceration details:    Location:  Scalp   Scalp location:  Occipital   Length (cm):  2 Pre-procedure details:    Preparation:  Patient was prepped and draped in usual sterile fashion and imaging obtained to evaluate for foreign bodies Exploration:    Hemostasis achieved with:  LET   Imaging outcome: foreign body not noted     Wound exploration: entire depth of wound visualized     Wound extent: no fascia violation noted, no foreign bodies/material noted, no muscle damage noted, no underlying fracture noted and no vascular damage noted     Contaminated: no   Treatment:    Area cleansed with:  Saline   Amount of cleaning:  Standard   Irrigation solution:  Sterile saline   Irrigation volume:  500   Irrigation method:  Syringe   Visualized foreign bodies/material  removed: no   Skin repair:    Repair method:  Staples   Number of staples:  2 Approximation:    Approximation:  Close Repair type:    Repair type:  Simple Post-procedure details:    Dressing:  Open (no dressing)     Medications Ordered in ED Medications  lidocaine-EPINEPHrine-tetracaine (LET) topical gel (3 mLs Topical Given 01/06/21 1917)  acetaminophen (TYLENOL) tablet 650 mg (650 mg Oral Given 01/06/21 2052)  Tdap (BOOSTRIX) injection 0.5 mL (0.5 mLs Intramuscular Given 01/06/21 2053)    ED Course  I have reviewed the triage vital signs and the nursing notes.  Pertinent labs & imaging results that were available during my care of the patient were reviewed by me and considered in my medical decision making (see chart for details).    MDM Rules/Calculators/A&P                          Patient presents after what appears to be mechanical fall.  According to the nursing home staff, she is at her baseline mental status.  She has had no recent illnesses.  She had a CT scan of her head and cervical spine which showed no acute abnormalities.  Her laceration was repaired with staples.  She had some x-rays of her shoulders which were reviewed by me and the radiologist.  No fractures are identified.  She has not reported any other injuries or any areas of concern.  She was discharged back to the nursing facility.  Wound care instructions and return precautions were given.  Her tetanus shot was updated as well. Final Clinical Impression(s) / ED Diagnoses Final diagnoses:  Fall, initial encounter  Laceration of scalp, initial encounter    Rx / DC Orders ED Discharge Orders    None       Rolan Bucco, MD 01/06/21 2114    Rolan Bucco, MD 01/06/21  2115  

## 2021-01-06 NOTE — ED Triage Notes (Addendum)
Pt BIB EMS from the [place] [facility] for mechanical fall backwards. Pt has lac to posterior head. Pt hypertensive on arrival

## 2021-01-06 NOTE — Discharge Instructions (Signed)
Your staples need to be removed in about 1 week.  Keep the area clean and dry.  He can use some antibiotic ointment to the wound.  Return here as needed for any worsening symptoms.

## 2021-05-20 IMAGING — CR DG NECK SOFT TISSUE
2 series · 2 of 2 positions shown · non-contrast
Comparison: None.

CLINICAL DATA: Neck pain.

EXAM:
NECK SOFT TISSUES - 1+ VIEW

[w soft tissue neck lat]
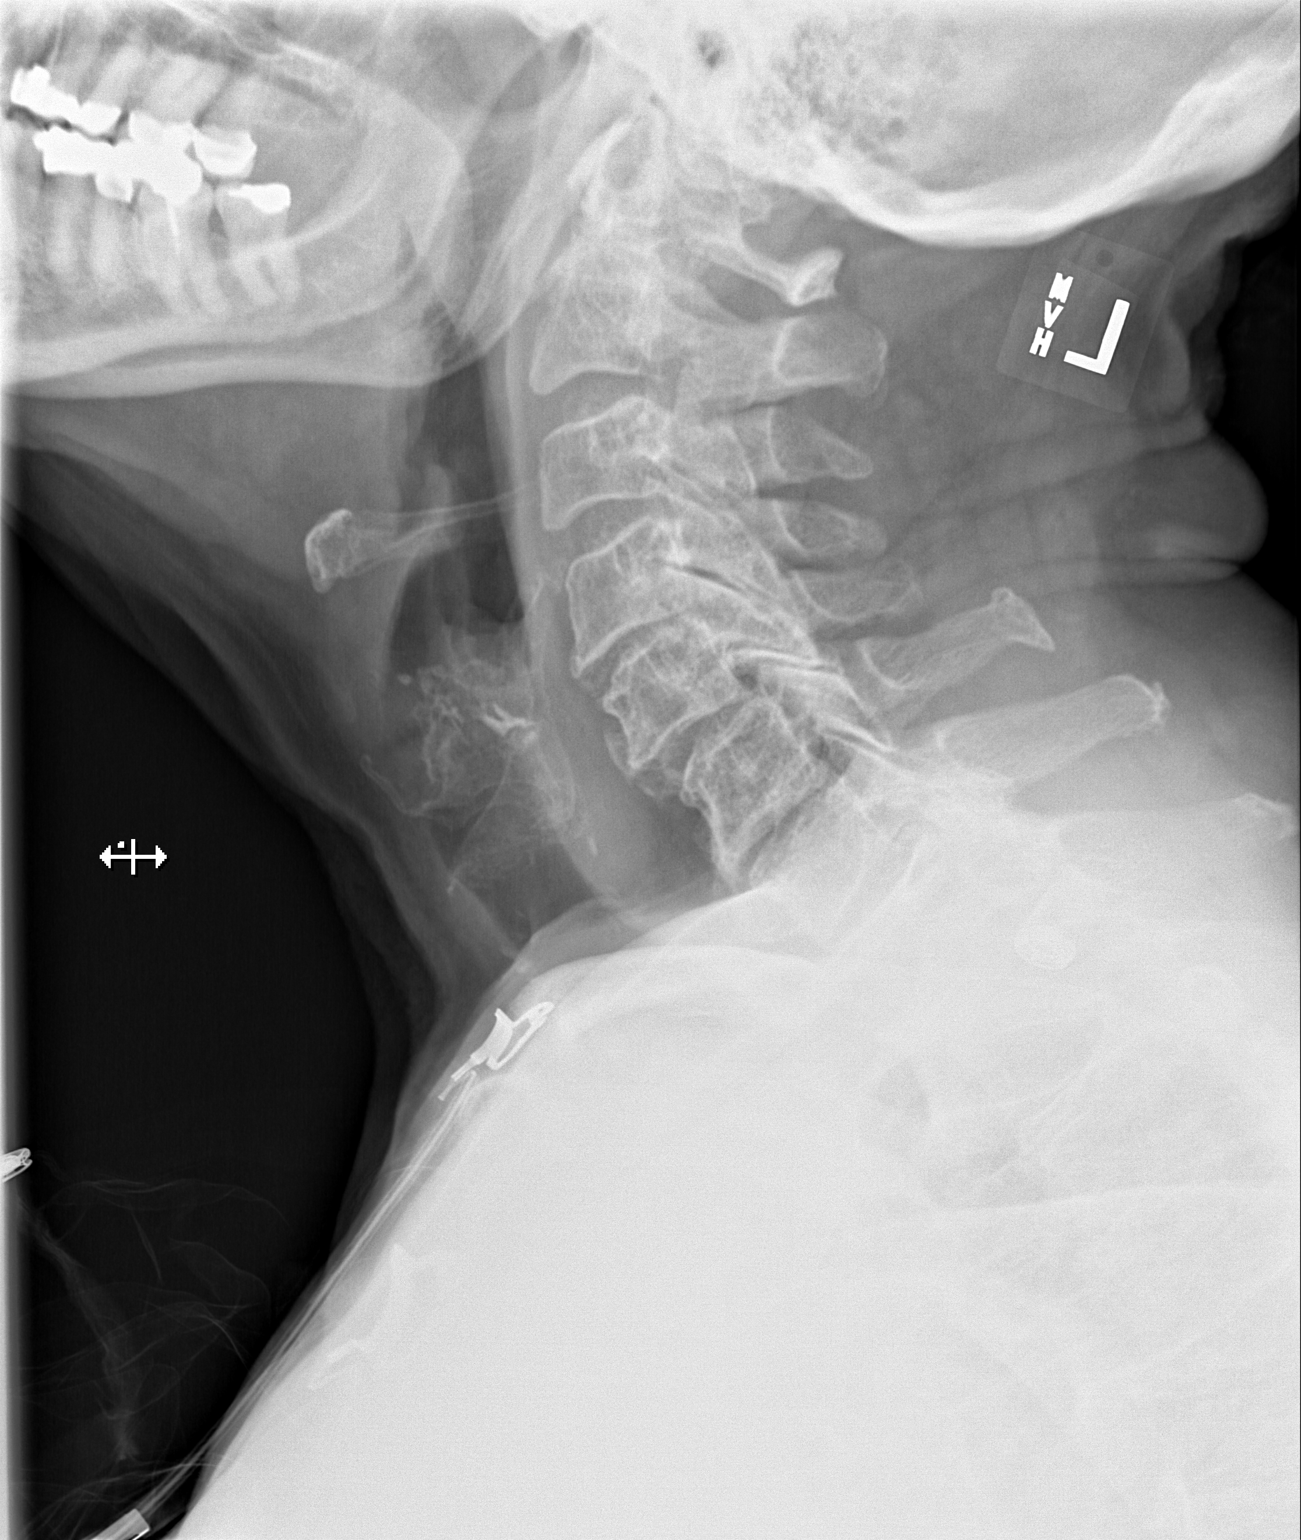

[x soft tissue neck ap]
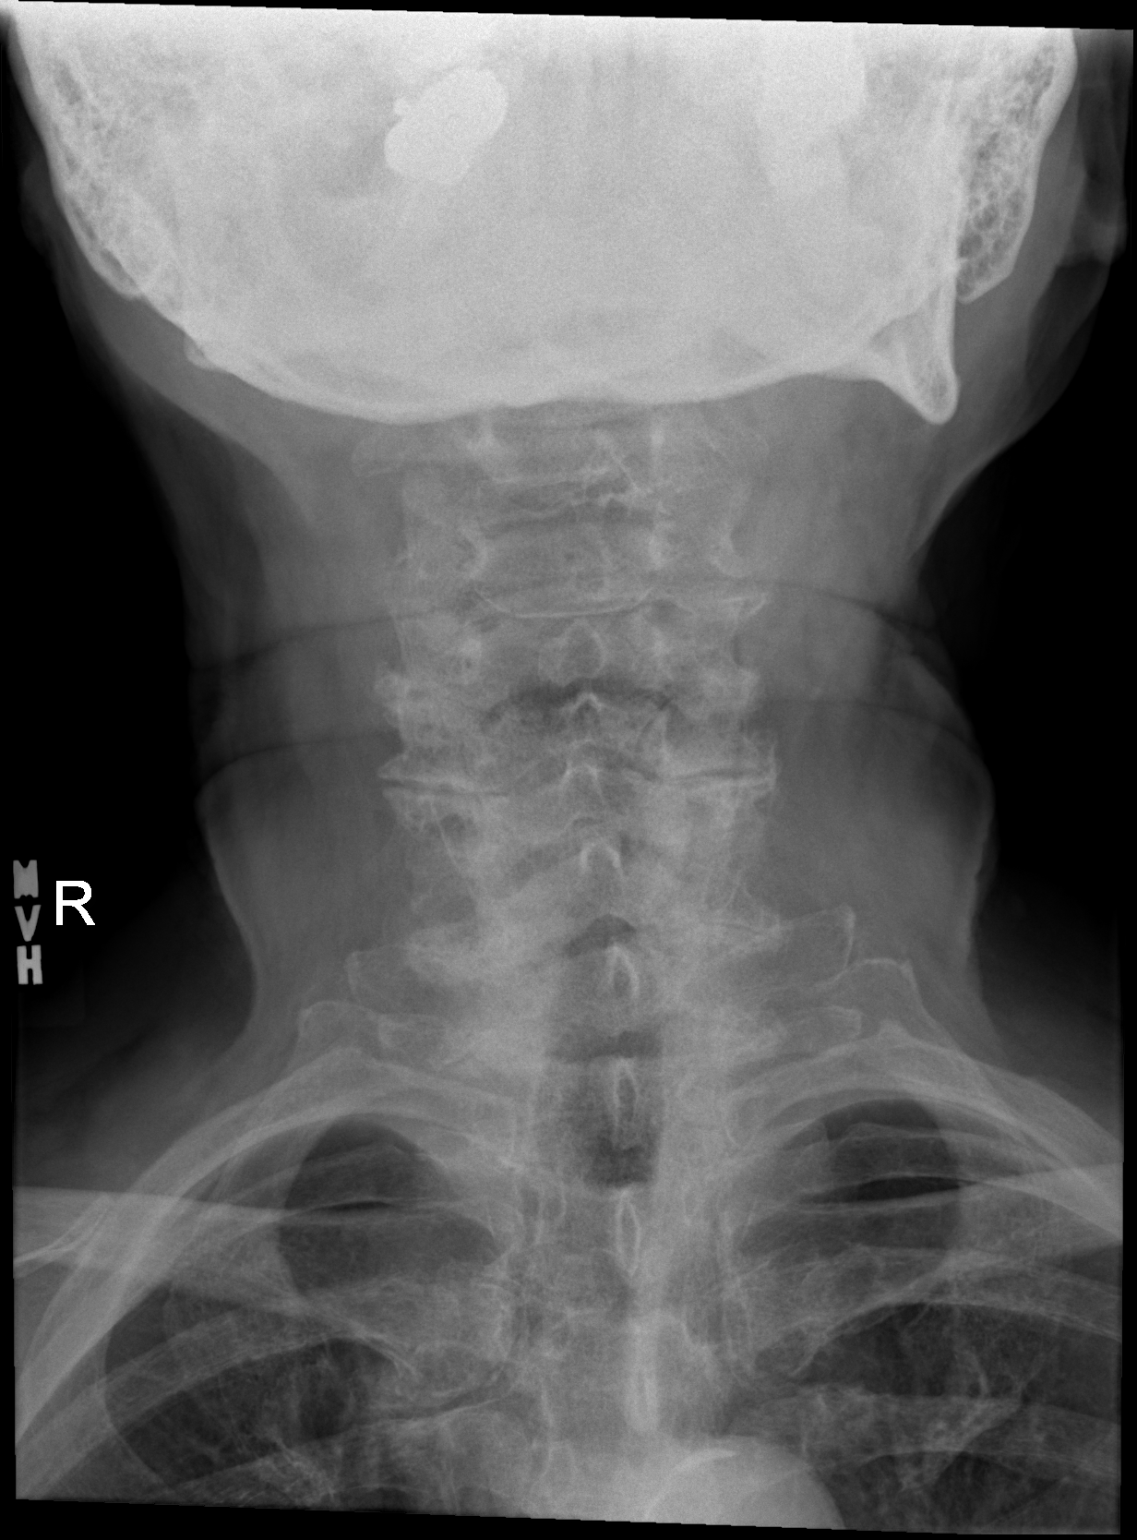

[2 of 2 positions shown; findings below may reference images not displayed]

FINDINGS: Advanced degenerative changes are noted of the cervical spine,
greatest at the C4-C5 and C5-C6 levels. There is no radiopaque
foreign body. No acute displaced fracture involving the visualized
osseous structures.
IMPRESSION: 1. No radiopaque foreign body.
2. Advanced degenerative changes of the cervical spine without
evidence for an acute osseous abnormality.

## 2021-05-25 ENCOUNTER — Other Ambulatory Visit: Payer: Self-pay

## 2021-05-25 ENCOUNTER — Telehealth: Payer: Self-pay

## 2021-05-25 ENCOUNTER — Non-Acute Institutional Stay: Payer: Medicare Other

## 2021-05-25 DIAGNOSIS — Z515 Encounter for palliative care: Secondary | ICD-10-CM

## 2021-05-25 NOTE — Telephone Encounter (Signed)
(  5:38 pm) SW telephoned patient's son- to update him on visit with patient today, palliative care services and support provided. He thanked SW for the call and encouraged SW to reach out to his sister who usually handles all patient's appointment. SW advised him that she called his sister first, but did not receive an answer and moved to next name on the list which was his. SW reiterated palliative services, visit frequency and and encouraged him to call with any questions or concerns.

## 2021-05-26 NOTE — Progress Notes (Signed)
COMMUNITY PALLIATIVE CARE SW NOTE  PATIENT NAME: Melissa Harris DOB: [birth year] MRN: [id number]  PRIMARY CARE PROVIDER: Rinaldo Cloud, NP  RESPONSIBLE PARTY:  Acct ID - Guarantor Home Phone Work Phone Relationship Acct Type  [id number] [name], [name]  Self P/F     [address], [address], [place], [place] [address]     PLAN OF CARE and INTERVENTIONS:             GOALS OF CARE/ ADVANCE CARE PLANNING:  Goal is for patient to remain in the facility. Patient is a DNR.  SOCIAL/EMOTIONAL/SPIRITUAL ASSESSMENT/ INTERVENTIONS:  SW met with patient at the facility, [facility]. Patient was sitting in her recliner in her room. She greeted SW warmly. She denied pain. Patient did not appear to be in any pain or discomfort. Patient provided background information and a status update on herself. Patient report that she spends most of her day watching TV. She report intermittent pain to her joints, right arm/shoulder and hip. She report that she tries to move daily to avoid getting stiff. She has a history of falls. Her most recent fall was on 01/06/21 that required a ED visit.  She report that she sleeps more throughout.  Patient is continent of bowel/bladder.She does do her ADL's independently, but needs occasional help from the staff. SW observed that patient repeated herself several times. She sat the entire time. SOCIAL HISTORY: Patient was born and raised [place], but later moved to several states. She attended college, but did not finish. 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 She is widowed. She is the mother of six (1 daughter & 5 sons). Patient is a DNR. She is [employer] by [employer]. SW telephoned patient's daughter and received no answer, but talked with patient's son-Melissa Harris to update him on visit, and provide education about the services. He thanked SW for the call and encouraged SW to call his sister who manages patient care. SW advised him that she called patient's daughter and received no answer-SW  to follow-up. Patient and son remain open to ongoing support by the palliative care team. SW attempted to consult with the med tech, but she was not available.  PATIENT/CAREGIVER EDUCATION/ COPING:  Patient appears to be coping. Patient seems to have good family support.  PERSONAL EMERGENCY PLAN:  Per facility protocol COMMUNITY RESOURCES COORDINATION/ HEALTH CARE NAVIGATION:  None. FINANCIAL/LEGAL CONCERNS/INTERVENTIONS:  None.     SOCIAL HX:  Social History   Tobacco Use   Smoking status: Never   Smokeless tobacco: Never  Substance Use Topics   Alcohol use: No    Alcohol/week: 0.0 standard drinks    CODE STATUS: DNR ADVANCED DIRECTIVES: To be assessed MOST FORM COMPLETE:  To be assessed HOSPICE EDUCATION PROVIDED: No  PPS: Patient is alert and oriented to self and situation, but is able to engage in conversation. She is able to ambulate with a four-prong cane and is independent for ADL's, with occasional assistance.   [address] [place], [place]

## 2021-07-04 ENCOUNTER — Emergency Department (HOSPITAL_COMMUNITY)
Admission: EM | Admit: 2021-07-04 | Discharge: 2021-07-06 | Disposition: A | Discharge status: Home / Self Care | Payer: Medicare Other | Attending: Emergency Medicine | Admitting: Emergency Medicine

## 2021-07-04 ENCOUNTER — Emergency Department (HOSPITAL_COMMUNITY): Payer: Medicare Other

## 2021-07-04 ENCOUNTER — Other Ambulatory Visit: Payer: Self-pay

## 2021-07-04 DIAGNOSIS — Z7901 Long term (current) use of anticoagulants: Secondary | ICD-10-CM | POA: Insufficient documentation

## 2021-07-04 DIAGNOSIS — I10 Essential (primary) hypertension: Secondary | ICD-10-CM | POA: Diagnosis not present

## 2021-07-04 DIAGNOSIS — Z96653 Presence of artificial knee joint, bilateral: Secondary | ICD-10-CM | POA: Insufficient documentation

## 2021-07-04 DIAGNOSIS — T1490XA Injury, unspecified, initial encounter: Secondary | ICD-10-CM

## 2021-07-04 DIAGNOSIS — W1839XA Other fall on same level, initial encounter: Secondary | ICD-10-CM | POA: Diagnosis not present

## 2021-07-04 DIAGNOSIS — Z96643 Presence of artificial hip joint, bilateral: Secondary | ICD-10-CM | POA: Insufficient documentation

## 2021-07-04 DIAGNOSIS — Z20822 Contact with and (suspected) exposure to covid-19: Secondary | ICD-10-CM | POA: Diagnosis not present

## 2021-07-04 DIAGNOSIS — S73004A Unspecified dislocation of right hip, initial encounter: Secondary | ICD-10-CM

## 2021-07-04 DIAGNOSIS — T84020A Dislocation of internal right hip prosthesis, initial encounter: Secondary | ICD-10-CM | POA: Insufficient documentation

## 2021-07-04 LAB — CBC
HCT: 39.9 % (ref 36.0–46.0)
Hemoglobin: 13.2 g/dL (ref 12.0–15.0)
MCH: 32.4 pg (ref 26.0–34.0)
MCHC: 33.1 g/dL (ref 30.0–36.0)
MCV: 97.8 fL (ref 80.0–100.0)
Platelets: 174 10*3/uL (ref 150–400)
RBC: 4.08 MIL/uL (ref 3.87–5.11)
RDW: 12.4 % (ref 11.5–15.5)
WBC: 9.4 10*3/uL (ref 4.0–10.5)
nRBC: 0 % (ref 0.0–0.2)

## 2021-07-04 LAB — COMPREHENSIVE METABOLIC PANEL
ALT: 9 U/L (ref 0–44)
AST: 13 U/L — ABNORMAL LOW (ref 15–41)
Albumin: 3.4 g/dL — ABNORMAL LOW (ref 3.5–5.0)
Alkaline Phosphatase: 66 U/L (ref 38–126)
Anion gap: 9 (ref 5–15)
BUN: 20 mg/dL (ref 8–23)
CO2: 27 mmol/L (ref 22–32)
Calcium: 9 mg/dL (ref 8.9–10.3)
Chloride: 102 mmol/L (ref 98–111)
Creatinine, Ser: 0.96 mg/dL (ref 0.44–1.00)
GFR, Estimated: 56 mL/min — ABNORMAL LOW (ref >60)
Glucose, Bld: 131 mg/dL — ABNORMAL HIGH (ref 70–99)
Potassium: 3.6 mmol/L (ref 3.5–5.1)
Sodium: 138 mmol/L (ref 135–145)
Total Bilirubin: 1 mg/dL (ref 0.3–1.2)
Total Protein: 6.6 g/dL (ref 6.5–8.1)

## 2021-07-04 LAB — RESP PANEL BY RT-PCR (FLU A&B, COVID) ARPGX2
Influenza A by PCR: NEGATIVE
Influenza B by PCR: NEGATIVE
SARS Coronavirus 2 by RT PCR: NEGATIVE

## 2021-07-04 MED ORDER — ONDANSETRON HCL 4 MG/2ML IJ SOLN
4.0000 mg | Freq: Once | INTRAMUSCULAR | Status: AC
  Administered 2021-07-04: 4 mg via INTRAVENOUS
  Filled 2021-07-04: qty 2

## 2021-07-04 MED ORDER — SODIUM CHLORIDE 0.9 % IV BOLUS
500.0000 mL | Freq: Once | INTRAVENOUS | Status: AC
  Administered 2021-07-04: 500 mL via INTRAVENOUS

## 2021-07-04 MED ORDER — HYDROMORPHONE HCL 1 MG/ML IJ SOLN
1.0000 mg | Freq: Once | INTRAMUSCULAR | Status: AC
  Administered 2021-07-04: 1 mg via INTRAVENOUS
  Filled 2021-07-04: qty 1

## 2021-07-04 MED ORDER — PROPOFOL 10 MG/ML IV BOLUS
0.5000 mg/kg | Freq: Once | INTRAVENOUS | Status: DC
  Filled 2021-07-04: qty 20

## 2021-07-04 MED ORDER — PROPOFOL 10 MG/ML IV BOLUS
INTRAVENOUS | Status: AC | PRN
  Administered 2021-07-04: 39.6 mg via INTRAVENOUS

## 2021-07-04 NOTE — ED Notes (Signed)
Voicemail left for ortho tech 

## 2021-07-04 NOTE — Consult Note (Signed)
Reason for Consult:Right hip dislocation Referring Physician: Dr. Pieter Partridge, MD  Melissa Harris is an 87 y.o. female.  HPI: This is a very pleasant 87 year old female who was brought to the ED today via EMS from [facility] due to a unwitnessed mechanical fall that resulted in a right hip fracture.  This happened around 7 PM this evening.  There is an obvious deformity to the right hip, shortened and rotated.  She has had a previous right total hip arthroplasty by Dr. Lequita Halt  in the past.  Patient's daughter is bedside and is unsure exactly how long ago this replacement was.  This is her first dislocation.  Daughter is giving most of history at this time.  Patient does complain of pain in the right lower extremity.  Past Medical History:  Diagnosis Date   Carpal tunnel syndrome    left    Depression    GERD (gastroesophageal reflux disease)    Hypertension    Nocturia    Osteoarthritis     Past Surgical History:  Procedure Laterality Date   APPENDECTOMY     CATARACT EXTRACTION     CHOLECYSTECTOMY     TONSILLECTOMY AND ADENOIDECTOMY     TOTAL HIP ARTHROPLASTY Right    TOTAL HIP ARTHROPLASTY Left 12/15/2016   Procedure: LEFT TOTAL HIP ARTHROPLASTY ANTERIOR APPROACH;  Surgeon: Ollen Gross, MD;  Location: [facility];  Service: Orthopedics;  Laterality: Left;  requests   TOTAL KNEE ARTHROPLASTY Right    TOTAL KNEE ARTHROPLASTY Left 12/20/2014   Procedure: LEFT TOTAL KNEE ARTHROPLASTY;  Surgeon: Ollen Gross, MD;  Location: [facility];  Service: Orthopedics;  Laterality: Left;    Family History  Problem Relation Age of Onset   Dementia Other        Died age 48    Social History:  reports that she has never smoked. She has never used smokeless tobacco. She reports that she does not drink alcohol and does not use drugs.  Allergies: No Known Allergies  Medications: I have reviewed the patient's current medications.  Results for orders placed or performed during the hospital encounter  of 07/04/21 (from the past 48 hour(s))  Comprehensive metabolic panel     Status: Abnormal   Collection Time: 07/04/21  8:52 PM  Result Value Ref Range   Sodium 138 135 - 145 mmol/L   Potassium 3.6 3.5 - 5.1 mmol/L   Chloride 102 98 - 111 mmol/L   CO2 27 22 - 32 mmol/L   Glucose, Bld 131 (H) 70 - 99 mg/dL    Comment: Glucose reference range applies only to samples taken after fasting for at least 8 hours.   BUN 20 8 - 23 mg/dL   Creatinine, Ser 1.61 0.44 - 1.00 mg/dL   Calcium 9.0 8.9 - 09.6 mg/dL   Total Protein 6.6 6.5 - 8.1 g/dL   Albumin 3.4 (L) 3.5 - 5.0 g/dL   AST 13 (L) 15 - 41 U/L   ALT 9 0 - 44 U/L   Alkaline Phosphatase 66 38 - 126 U/L   Total Bilirubin 1.0 0.3 - 1.2 mg/dL   GFR, Estimated 56 (L) >60 mL/min    Comment: (NOTE) Calculated using the CKD-EPI Creatinine Equation (2021)    Anion gap 9 5 - 15    Comment: Performed at [facility], [address] [place], [place] [address]  CBC     Status: None   Collection Time: 07/04/21  8:52 PM  Result Value Ref  Range   WBC 9.4 4.0 - 10.5 K/uL   RBC 4.08 3.87 - 5.11 MIL/uL   Hemoglobin 13.2 12.0 - 15.0 g/dL   HCT 10.2 58.5 - 27.7 %   MCV 97.8 80.0 - 100.0 fL   MCH 32.4 26.0 - 34.0 pg   MCHC 33.1 30.0 - 36.0 g/dL   RDW 82.4 23.5 - 36.1 %   Platelets 174 150 - 400 K/uL   nRBC 0.0 0.0 - 0.2 %    Comment: Performed at [facility], [address] [place], [place] [address]  Resp Panel by RT-PCR (Flu A&B, Covid) Nasopharyngeal Swab     Status: None   Collection Time: 07/04/21  9:00 PM   Specimen: Nasopharyngeal Swab; Nasopharyngeal(NP) swabs in vial transport medium  Result Value Ref Range   SARS Coronavirus 2 by RT PCR NEGATIVE NEGATIVE    Comment: (NOTE) SARS-CoV-2 target nucleic acids are NOT DETECTED.  The SARS-CoV-2 RNA is generally detectable in upper respiratory specimens during the acute phase of infection. The lowest concentration of SARS-CoV-2 viral copies this assay  can detect is 138 copies/mL. A negative result does not preclude SARS-Cov-2 infection and should not be used as the sole basis for treatment or other patient management decisions. A negative result may occur with  improper specimen collection/handling, submission of specimen other than nasopharyngeal swab, presence of viral mutation(s) within the areas targeted by this assay, and inadequate number of viral copies(<138 copies/mL). A negative result must be combined with clinical observations, patient history, and epidemiological information. The expected result is Negative.  Fact Sheet for Patients:  BloggerCourse.com  Fact Sheet for Healthcare Providers:  SeriousBroker.it  This test is no t yet approved or cleared by the [place] FDA and  has been authorized for detection and/or diagnosis of SARS-CoV-2 by FDA under an Emergency Use Authorization (EUA). This EUA will remain  in effect (meaning this test can be used) for the duration of the COVID-19 declaration under Section 564(b)(1) of the Act, 21 U.S.C.section 360bbb-3(b)(1), unless the authorization is terminated  or revoked sooner.       Influenza A by PCR NEGATIVE NEGATIVE   Influenza B by PCR NEGATIVE NEGATIVE    Comment: (NOTE) The Xpert Xpress SARS-CoV-2/FLU/RSV plus assay is intended as an aid in the diagnosis of influenza from Nasopharyngeal swab specimens and should not be used as a sole basis for treatment. Nasal washings and aspirates are unacceptable for Xpert Xpress SARS-CoV-2/FLU/RSV testing.  Fact Sheet for Patients: BloggerCourse.com  Fact Sheet for Healthcare Providers: SeriousBroker.it  This test is not yet approved or cleared by the [place] FDA and has been authorized for detection and/or diagnosis of SARS-CoV-2 by FDA under an Emergency Use Authorization (EUA). This EUA will remain in effect  (meaning this test can be used) for the duration of the COVID-19 declaration under Section 564(b)(1) of the Act, 21 U.S.C. section 360bbb-3(b)(1), unless the authorization is terminated or revoked.  Performed at [facility], [address], [place], [place] [address]     DG Shoulder Right  Result Date: 07/04/2021 CLINICAL DATA:  Status post fall EXAM: RIGHT SHOULDER - 2+ VIEW; RIGHT WRIST - COMPLETE 3+ VIEW; RIGHT ELBOW - COMPLETE 3+ VIEW COMPARISON:  None. FINDINGS: Right shoulder: No evidence of fracture or dislocation. Severe degenerative changes of the glenohumeral and acromioclavicular joints. No aggressive appearing focal bone abnormality. Soft tissues are unremarkable. Right elbow: No evidence of fracture, dislocation, or joint effusion. No evidence of severe  arthropathy. No aggressive appearing focal bone abnormality. Soft tissues are unremarkable. Right wrist: No evidence of fracture, dislocation, or joint effusion. Severe degenerative changes of first carpometacarpal joint. At least mild carpal joint degenerative changes. Query chondrocalcinosis at the TFCC. No aggressive appearing focal bone abnormality. Soft tissues are unremarkable. IMPRESSION: No acute displaced fracture or dislocation of the bones of the right wrist, elbow, shoulder. Electronically Signed   By: Tish Frederickson M.D.   On: 07/04/2021 21:43   DG Elbow Complete Right  Result Date: 07/04/2021 CLINICAL DATA:  Status post fall EXAM: RIGHT SHOULDER - 2+ VIEW; RIGHT WRIST - COMPLETE 3+ VIEW; RIGHT ELBOW - COMPLETE 3+ VIEW COMPARISON:  None. FINDINGS: Right shoulder: No evidence of fracture or dislocation. Severe degenerative changes of the glenohumeral and acromioclavicular joints. No aggressive appearing focal bone abnormality. Soft tissues are unremarkable. Right elbow: No evidence of fracture, dislocation, or joint effusion. No evidence of severe arthropathy. No aggressive appearing focal bone  abnormality. Soft tissues are unremarkable. Right wrist: No evidence of fracture, dislocation, or joint effusion. Severe degenerative changes of first carpometacarpal joint. At least mild carpal joint degenerative changes. Query chondrocalcinosis at the TFCC. No aggressive appearing focal bone abnormality. Soft tissues are unremarkable. IMPRESSION: No acute displaced fracture or dislocation of the bones of the right wrist, elbow, shoulder. Electronically Signed   By: Tish Frederickson M.D.   On: 07/04/2021 21:43   DG Wrist Complete Right  Result Date: 07/04/2021 CLINICAL DATA:  Status post fall EXAM: RIGHT SHOULDER - 2+ VIEW; RIGHT WRIST - COMPLETE 3+ VIEW; RIGHT ELBOW - COMPLETE 3+ VIEW COMPARISON:  None. FINDINGS: Right shoulder: No evidence of fracture or dislocation. Severe degenerative changes of the glenohumeral and acromioclavicular joints. No aggressive appearing focal bone abnormality. Soft tissues are unremarkable. Right elbow: No evidence of fracture, dislocation, or joint effusion. No evidence of severe arthropathy. No aggressive appearing focal bone abnormality. Soft tissues are unremarkable. Right wrist: No evidence of fracture, dislocation, or joint effusion. Severe degenerative changes of first carpometacarpal joint. At least mild carpal joint degenerative changes. Query chondrocalcinosis at the TFCC. No aggressive appearing focal bone abnormality. Soft tissues are unremarkable. IMPRESSION: No acute displaced fracture or dislocation of the bones of the right wrist, elbow, shoulder. Electronically Signed   By: Tish Frederickson M.D.   On: 07/04/2021 21:43   CT HEAD WO CONTRAST  Result Date: 07/04/2021 CLINICAL DATA:  Status post fall EXAM: CT HEAD WITHOUT CONTRAST CT CERVICAL SPINE WITHOUT CONTRAST TECHNIQUE: Multidetector CT imaging of the head and cervical spine was performed following the standard protocol without intravenous contrast. Multiplanar CT image reconstructions of the cervical spine  were also generated. COMPARISON:  None. FINDINGS: CT HEAD FINDINGS BRAIN: BRAIN Cerebral ventricle sizes are concordant with the degree of cerebral volume loss. No evidence of large-territorial acute infarction. No parenchymal hemorrhage. No mass lesion. No extra-axial collection. No mass effect or midline shift. No hydrocephalus. Basilar cisterns are patent. Vascular: No hyperdense vessel. Skull: No acute fracture or focal lesion. Sinuses/Orbits: Paranasal sinuses and mastoid air cells are clear. Bilateral lens replacement. Otherwise the orbits are unremarkable. Other: None. CT CERVICAL SPINE FINDINGS Alignment: Grade 1 anterolisthesis of C5 on C6 and C7 on T1. Skull base and vertebrae: Multilevel degenerative changes of the spine. No severe osseous neural foraminal or central canal stenosis. No definite severe osseous neural foraminal or central canal stenosis. No acute fracture. No aggressive appearing focal osseous lesion or focal pathologic process. Soft tissues and spinal canal: No prevertebral fluid  or swelling. No visible canal hematoma. Upper chest: Unremarkable. Other: None. IMPRESSION: 1. No acute intracranial abnormality. 2. No acute displaced fracture or traumatic listhesis of the cervical spine. Electronically Signed   By: Tish Frederickson M.D.   On: 07/04/2021 21:59   CT CERVICAL SPINE WO CONTRAST  Result Date: 07/04/2021 CLINICAL DATA:  Status post fall EXAM: CT HEAD WITHOUT CONTRAST CT CERVICAL SPINE WITHOUT CONTRAST TECHNIQUE: Multidetector CT imaging of the head and cervical spine was performed following the standard protocol without intravenous contrast. Multiplanar CT image reconstructions of the cervical spine were also generated. COMPARISON:  None. FINDINGS: CT HEAD FINDINGS BRAIN: BRAIN Cerebral ventricle sizes are concordant with the degree of cerebral volume loss. No evidence of large-territorial acute infarction. No parenchymal hemorrhage. No mass lesion. No extra-axial collection. No  mass effect or midline shift. No hydrocephalus. Basilar cisterns are patent. Vascular: No hyperdense vessel. Skull: No acute fracture or focal lesion. Sinuses/Orbits: Paranasal sinuses and mastoid air cells are clear. Bilateral lens replacement. Otherwise the orbits are unremarkable. Other: None. CT CERVICAL SPINE FINDINGS Alignment: Grade 1 anterolisthesis of C5 on C6 and C7 on T1. Skull base and vertebrae: Multilevel degenerative changes of the spine. No severe osseous neural foraminal or central canal stenosis. No definite severe osseous neural foraminal or central canal stenosis. No acute fracture. No aggressive appearing focal osseous lesion or focal pathologic process. Soft tissues and spinal canal: No prevertebral fluid or swelling. No visible canal hematoma. Upper chest: Unremarkable. Other: None. IMPRESSION: 1. No acute intracranial abnormality. 2. No acute displaced fracture or traumatic listhesis of the cervical spine. Electronically Signed   By: Tish Frederickson M.D.   On: 07/04/2021 21:59   DG Chest Port 1 View  Result Date: 07/04/2021 CLINICAL DATA:  Unwitnessed fall. EXAM: PORTABLE CHEST 1 VIEW COMPARISON:  Radiograph 06/23/2020 FINDINGS: Stable heart size and mediastinal contours, with borderline mild cardiomegaly. Aortic atherosclerosis. Skin fold projects over the left hemithorax extending into the left upper abdomen. No pneumothorax. No focal airspace disease or pleural effusion. No definite pulmonary edema. The bones appear under mineralized. There is chronic change about both shoulders, right shoulder assessed on concurrent shoulder exam. IMPRESSION: No acute chest findings. Skin fold projects over the left hemithorax. Electronically Signed   By: Narda Rutherford M.D.   On: 07/04/2021 21:42   DG Hip Unilat W or Wo Pelvis 2-3 Views Right  Result Date: 07/04/2021 CLINICAL DATA:  Fall, right hip deformity EXAM: DG HIP (WITH OR WITHOUT PELVIS) 2-3V RIGHT COMPARISON:  04/28/2018 FINDINGS:  Bilateral total hip arthroplasty has been performed. There is superior dislocation of the right femoral head component in relation to the acetabular cup. No associated fracture. Left hip arthroplasty is normally aligned. Vascular calcifications are seen within the pelvis. IMPRESSION: Superior right hip dislocation. Electronically Signed   By: Helyn Numbers M.D.   On: 07/04/2021 21:51    Review of Systems Blood pressure (!) 150/65, pulse 64, temperature 98.5 F (36.9 C), resp. rate 15, height 5\' 7"  (1.702 m), weight 79.2 kg, SpO2 100 %. Physical Exam  Assessment/Plan: Right artifical hip superior dislocation:  Dr. and myself both were present during the consult today.  He spoke with the daughter and recommended a closed reduction with sedation.  Daughter is agreeable. She was consented for a closed reduction of the right hip under light sedation.   The closed reduction was done in the room with the ED Dr. Present, respiratory, Dr. Thomasena Edis, myself and 2 nurses. It was a  successful closed reduction of the right hip. Post reduction films were taken. Good alignment of the hip on post reduction films noted. Patient was placed in a knee immobilizer. She needs to keep this on for 3 weeks. Follow up with Dr. Lequita Halt in the office in 10 days for reevaluation. She is okay to be discharged home once stable from an ortho stand point.   Dr. Thomasena Edis was present during the entire time.   Denman George [facility] [phone] 07/04/2021, 10:58 PM

## 2021-07-04 NOTE — Sedation Documentation (Signed)
Xray to bedside.

## 2021-07-04 NOTE — ED Notes (Signed)
Pt daughter Drinda Butts requesting update on POC and to be contacted when pt is discharged, [phone]

## 2021-07-04 NOTE — ED Notes (Signed)
Placed pt on [facility]

## 2021-07-04 NOTE — ED Provider Notes (Signed)
[facility]-EMERGENCY DEPT Provider Note   CSN: [id number] Arrival date & time: 07/04/21  2019     History Chief Complaint  Patient presents with   Leg Injury    right   Fall    Melissa Harris is a 87 y.o. female.  Mercades Bajaj Wurth fell today.  She is unable to describe the fall or the circumstances surrounding the fall.  However, EMS arrived and found her with her right hip positioned in an extremely awkward position.  They felt that her dorsalis pedis pulse was absent, and when they moved her to get her onto the stretcher, they were able to reobtain pulses.  The patient is complaining of pain in her right arm diffusely.  The history is provided by the patient.  Fall This is a new problem. The current episode started 1 to 2 hours ago. The problem occurs constantly. The problem has been resolved. Pertinent negatives include no chest pain, no abdominal pain, no headaches and no shortness of breath. Exacerbated by: movement. Nothing relieves the symptoms. She has tried nothing for the symptoms. The treatment provided no relief.      Past Medical History:  Diagnosis Date   Carpal tunnel syndrome    left    Depression    GERD (gastroesophageal reflux disease)    Hypertension    Nocturia    Osteoarthritis     Patient Active Problem List   Diagnosis Date Noted   OA (osteoarthritis) of hip 12/15/2016   OA (osteoarthritis) of knee 12/20/2014   Preop cardiovascular exam 11/28/2014   Palpitations 11/28/2014    Past Surgical History:  Procedure Laterality Date   APPENDECTOMY     CATARACT EXTRACTION     CHOLECYSTECTOMY     TONSILLECTOMY AND ADENOIDECTOMY     TOTAL HIP ARTHROPLASTY Right    TOTAL HIP ARTHROPLASTY Left 12/15/2016   Procedure: LEFT TOTAL HIP ARTHROPLASTY ANTERIOR APPROACH;  Surgeon: Ollen Gross, MD;  Location: [facility];  Service: Orthopedics;  Laterality: Left;  requests   TOTAL KNEE ARTHROPLASTY Right    TOTAL KNEE ARTHROPLASTY Left  12/20/2014   Procedure: LEFT TOTAL KNEE ARTHROPLASTY;  Surgeon: Ollen Gross, MD;  Location: [facility];  Service: Orthopedics;  Laterality: Left;     OB History   No obstetric history on file.     Family History  Problem Relation Age of Onset   Dementia Other        Died age 35    Social History   Tobacco Use   Smoking status: Never   Smokeless tobacco: Never  Substance Use Topics   Alcohol use: No    Alcohol/week: 0.0 standard drinks   Drug use: No    Home Medications Prior to Admission medications   Medication Sig Start Date End Date Taking? Authorizing Provider  acetaminophen (TYLENOL) 325 MG tablet Take 2 tablets (650 mg total) by mouth every 6 (six) hours as needed for mild pain (or Fever >/= 101). Patient not taking: No sig reported 12/23/14   Julien Girt, Alexzandrew L, PA-C  acetaminophen (TYLENOL) 500 MG tablet Take 1,000 mg by mouth 2 (two) times daily.    [provider]  bisacodyl (DULCOLAX) 10 MG suppository Place 1 suppository (10 mg total) rectally daily as needed for moderate constipation. Patient not taking: No sig reported 12/23/14   Julien Girt, Alexzandrew L, PA-C  divalproex (DEPAKOTE) 125 MG DR tablet Take 125 mg by mouth 2 (two) times daily.    [provider]  docusate sodium (COLACE) 100 MG capsule Take 1 capsule (100 mg total) by mouth 2 (two) times daily. Patient not taking: No sig reported 12/23/14   Perkins, Alexzandrew L, PA-C  magnesium hydroxide (MILK OF MAGNESIA) 400 MG/5ML suspension Take 30 mLs by mouth every 6 (six) hours as needed for mild constipation.    [provider]  memantine (NAMENDA) 5 MG tablet Take 5 mg by mouth 2 (two) times daily.    [provider]  methocarbamol (ROBAXIN) 500 MG tablet Take 1 tablet (500 mg total) by mouth every 6 (six) hours as needed for muscle spasms. Patient not taking: No sig reported 12/17/16   Julien Girt, Alexzandrew L, PA-C  metoprolol tartrate (LOPRESSOR) 25 MG tablet Take 25-50 mg  by mouth See admin instructions. Take 25 mg in the morning and 50 mg in the evening    [provider]  omeprazole (PRILOSEC) 40 MG capsule Take 1 capsule (40 mg total) by mouth daily. Patient not taking: Reported on 01/06/2021 02/11/20   Mare Ferrari, PA-C  pantoprazole (PROTONIX) 40 MG tablet Take 40 mg by mouth 2 (two) times daily.    [provider]  rivaroxaban (XARELTO) 10 MG TABS tablet Take 1 tablet (10 mg total) by mouth daily with breakfast. Take Xarelto for two and a half more weeks following discharge from the hospital, then discontinue Xarelto. Once the patient has completed the blood thinner regimen, then take a Baby 81 mg Aspirin daily for three more weeks. Patient not taking: No sig reported 12/18/16   Julien Girt, Alexzandrew L, PA-C  sertraline (ZOLOFT) 50 MG tablet Take 50 mg by mouth daily.    [provider]  simethicone (MYLICON) 80 MG chewable tablet Chew 1 tablet (80 mg total) by mouth every 6 (six) hours as needed for flatulence. Patient not taking: No sig reported 11/24/17   Casey Burkitt, MD  sucralfate (CARAFATE) 1 g tablet Take 1 tablet (1 g total) by mouth 4 (four) times daily -  with meals and at bedtime. Patient not taking: Reported on 01/06/2021 01/07/20   Derwood Kaplan, MD  traMADol (ULTRAM) 50 MG tablet Take 1-2 tablets (50-100 mg total) by mouth every 6 (six) hours as needed for moderate pain. Patient not taking: No sig reported 12/17/16   Julien Girt, Alexzandrew L, PA-C    Allergies    Patient has no known allergies.  Review of Systems   Review of Systems  Constitutional:  Negative for chills and fever.  HENT:  Negative for ear pain and sore throat.   Eyes:  Negative for pain and visual disturbance.  Respiratory:  Negative for cough and shortness of breath.   Cardiovascular:  Negative for chest pain and palpitations.  Gastrointestinal:  Negative for abdominal pain and vomiting.  Genitourinary:  Negative for dysuria and  hematuria.  Musculoskeletal:  Positive for neck pain. Negative for arthralgias and back pain.  Skin:  Negative for color change and rash.  Neurological:  Negative for seizures, syncope and headaches.  All other systems reviewed and are negative.  Physical Exam Updated Vital Signs BP (!) 185/76   Pulse (!) 55   Temp 98.5 F (36.9 C)   Resp 12   Ht  (1.702 m)   Wt 79.2 kg   SpO2 100%   BMI 27.36 kg/m   Physical Exam Vitals and nursing note reviewed.  HENT:     Head: Normocephalic and atraumatic.     Nose: Nose normal.     Mouth/Throat:  Comments: Dental decay; no acute trauma Eyes:     General: No scleral icterus.    Extraocular Movements: Extraocular movements intact.     Pupils: Pupils are equal, round, and reactive to light.  Neck:     Comments: C spine immobilized Midline tenderness Pulmonary:     Effort: Pulmonary effort is normal. No respiratory distress.  Chest:     Chest wall: No deformity, swelling, tenderness or crepitus.  Abdominal:     General: There is no distension.     Tenderness: There is no abdominal tenderness.  Musculoskeletal:     Thoracic back: No deformity or bony tenderness.     Lumbar back: No deformity or bony tenderness.     Comments: Obvious trauma to the right upper extremity, but she complains of severe pain with palpation and movement of all joints including shoulder, elbow, and wrist.  Distal pulses are intact.  Dorsalis pedis and posterior tibialis pulses are palpable on the right lower extremity.  The hip is internally rotated, and any slight movement produces pain.  Skin:    General: Skin is warm and dry.  Neurological:     Mental Status: She is alert and oriented to person, place, and time.     Cranial Nerves: Cranial nerves are intact.     Sensory: Sensation is intact.     Motor: Motor function is intact.  Psychiatric:        Mood and Affect: Mood normal.    ED Results / Procedures / Treatments   Labs (all labs  ordered are listed, but only abnormal results are displayed) Labs Reviewed  COMPREHENSIVE METABOLIC PANEL - Abnormal; Notable for the following components:      Result Value   Glucose, Bld 131 (*)    Albumin 3.4 (*)    AST 13 (*)    GFR, Estimated 56 (*)    All other components within normal limits  RESP PANEL BY RT-PCR (FLU A&B, COVID) ARPGX2  CBC  URINALYSIS, ROUTINE W REFLEX MICROSCOPIC    EKG None  Radiology DG Shoulder Right  Result Date: 07/04/2021 CLINICAL DATA:  Status post fall EXAM: RIGHT SHOULDER - 2+ VIEW; RIGHT WRIST - COMPLETE 3+ VIEW; RIGHT ELBOW - COMPLETE 3+ VIEW COMPARISON:  None. FINDINGS: Right shoulder: No evidence of fracture or dislocation. Severe degenerative changes of the glenohumeral and acromioclavicular joints. No aggressive appearing focal bone abnormality. Soft tissues are unremarkable. Right elbow: No evidence of fracture, dislocation, or joint effusion. No evidence of severe arthropathy. No aggressive appearing focal bone abnormality. Soft tissues are unremarkable. Right wrist: No evidence of fracture, dislocation, or joint effusion. Severe degenerative changes of first carpometacarpal joint. At least mild carpal joint degenerative changes. Query chondrocalcinosis at the TFCC. No aggressive appearing focal bone abnormality. Soft tissues are unremarkable. IMPRESSION: No acute displaced fracture or dislocation of the bones of the right wrist, elbow, shoulder. Electronically Signed   By: Tish Frederickson M.D.   On: 07/04/2021 21:43   DG Elbow Complete Right  Result Date: 07/04/2021 CLINICAL DATA:  Status post fall EXAM: RIGHT SHOULDER - 2+ VIEW; RIGHT WRIST - COMPLETE 3+ VIEW; RIGHT ELBOW - COMPLETE 3+ VIEW COMPARISON:  None. FINDINGS: Right shoulder: No evidence of fracture or dislocation. Severe degenerative changes of the glenohumeral and acromioclavicular joints. No aggressive appearing focal bone abnormality. Soft tissues are unremarkable. Right elbow: No  evidence of fracture, dislocation, or joint effusion. No evidence of severe arthropathy. No aggressive appearing focal bone abnormality. Soft tissues are unremarkable.  Right wrist: No evidence of fracture, dislocation, or joint effusion. Severe degenerative changes of first carpometacarpal joint. At least mild carpal joint degenerative changes. Query chondrocalcinosis at the TF[facility]. No aggressive appearing focal bone abnormality. Soft tissues are unremarkable. IMPRESSION: No acute displaced fracture or dislocation of the bones of the right wrist, elbow, shoulder. Electronically Signed   By: Tish Frederickson M.D.   On: 07/04/2021 21:43   DG Wrist Complete Right  Result Date: 07/04/2021 CLINICAL DATA:  Status post fall EXAM: RIGHT SHOULDER - 2+ VIEW; RIGHT WRIST - COMPLETE 3+ VIEW; RIGHT ELBOW - COMPLETE 3+ VIEW COMPARISON:  None. FINDINGS: Right shoulder: No evidence of fracture or dislocation. Severe degenerative changes of the glenohumeral and acromioclavicular joints. No aggressive appearing focal bone abnormality. Soft tissues are unremarkable. Right elbow: No evidence of fracture, dislocation, or joint effusion. No evidence of severe arthropathy. No aggressive appearing focal bone abnormality. Soft tissues are unremarkable. Right wrist: No evidence of fracture, dislocation, or joint effusion. Severe degenerative changes of first carpometacarpal joint. At least mild carpal joint degenerative changes. Query chondrocalcinosis at the TF[facility]. No aggressive appearing focal bone abnormality. Soft tissues are unremarkable. IMPRESSION: No acute displaced fracture or dislocation of the bones of the right wrist, elbow, shoulder. Electronically Signed   By: Tish Frederickson M.D.   On: 07/04/2021 21:43   CT HEAD WO CONTRAST  Result Date: 07/04/2021 CLINICAL DATA:  Status post fall EXAM: CT HEAD WITHOUT CONTRAST CT CERVICAL SPINE WITHOUT CONTRAST TECHNIQUE: Multidetector CT imaging of the head and cervical spine was  performed following the standard protocol without intravenous contrast. Multiplanar CT image reconstructions of the cervical spine were also generated. COMPARISON:  None. FINDINGS: CT HEAD FINDINGS BRAIN: BRAIN Cerebral ventricle sizes are concordant with the degree of cerebral volume loss. No evidence of large-territorial acute infarction. No parenchymal hemorrhage. No mass lesion. No extra-axial collection. No mass effect or midline shift. No hydrocephalus. Basilar cisterns are patent. Vascular: No hyperdense vessel. Skull: No acute fracture or focal lesion. Sinuses/Orbits: Paranasal sinuses and mastoid air cells are clear. Bilateral lens replacement. Otherwise the orbits are unremarkable. Other: None. CT CERVICAL SPINE FINDINGS Alignment: Grade 1 anterolisthesis of C5 on C6 and C7 on T1. Skull base and vertebrae: Multilevel degenerative changes of the spine. No severe osseous neural foraminal or central canal stenosis. No definite severe osseous neural foraminal or central canal stenosis. No acute fracture. No aggressive appearing focal osseous lesion or focal pathologic process. Soft tissues and spinal canal: No prevertebral fluid or swelling. No visible canal hematoma. Upper chest: Unremarkable. Other: None. IMPRESSION: 1. No acute intracranial abnormality. 2. No acute displaced fracture or traumatic listhesis of the cervical spine. Electronically Signed   By: Tish Frederickson M.D.   On: 07/04/2021 21:59   CT CERVICAL SPINE WO CONTRAST  Result Date: 07/04/2021 CLINICAL DATA:  Status post fall EXAM: CT HEAD WITHOUT CONTRAST CT CERVICAL SPINE WITHOUT CONTRAST TECHNIQUE: Multidetector CT imaging of the head and cervical spine was performed following the standard protocol without intravenous contrast. Multiplanar CT image reconstructions of the cervical spine were also generated. COMPARISON:  None. FINDINGS: CT HEAD FINDINGS BRAIN: BRAIN Cerebral ventricle sizes are concordant with the degree of cerebral volume  loss. No evidence of large-territorial acute infarction. No parenchymal hemorrhage. No mass lesion. No extra-axial collection. No mass effect or midline shift. No hydrocephalus. Basilar cisterns are patent. Vascular: No hyperdense vessel. Skull: No acute fracture or focal lesion. Sinuses/Orbits: Paranasal sinuses and mastoid air cells are clear. Bilateral lens  replacement. Otherwise the orbits are unremarkable. Other: None. CT CERVICAL SPINE FINDINGS Alignment: Grade 1 anterolisthesis of C5 on C6 and C7 on T1. Skull base and vertebrae: Multilevel degenerative changes of the spine. No severe osseous neural foraminal or central canal stenosis. No definite severe osseous neural foraminal or central canal stenosis. No acute fracture. No aggressive appearing focal osseous lesion or focal pathologic process. Soft tissues and spinal canal: No prevertebral fluid or swelling. No visible canal hematoma. Upper chest: Unremarkable. Other: None. IMPRESSION: 1. No acute intracranial abnormality. 2. No acute displaced fracture or traumatic listhesis of the cervical spine. Electronically Signed   By: Tish Frederickson M.D.   On: 07/04/2021 21:59   DG Chest Port 1 View  Result Date: 07/04/2021 CLINICAL DATA:  Unwitnessed fall. EXAM: PORTABLE CHEST 1 VIEW COMPARISON:  Radiograph 06/23/2020 FINDINGS: Stable heart size and mediastinal contours, with borderline mild cardiomegaly. Aortic atherosclerosis. Skin fold projects over the left hemithorax extending into the left upper abdomen. No pneumothorax. No focal airspace disease or pleural effusion. No definite pulmonary edema. The bones appear under mineralized. There is chronic change about both shoulders, right shoulder assessed on concurrent shoulder exam. IMPRESSION: No acute chest findings. Skin fold projects over the left hemithorax. Electronically Signed   By: Narda Rutherford M.D.   On: 07/04/2021 21:42   DG Hip Unilat W or Wo Pelvis 2-3 Views Right  Result Date:  07/04/2021 CLINICAL DATA:  Fall, right hip deformity EXAM: DG HIP (WITH OR WITHOUT PELVIS) 2-3V RIGHT COMPARISON:  04/28/2018 FINDINGS: Bilateral total hip arthroplasty has been performed. There is superior dislocation of the right femoral head component in relation to the acetabular cup. No associated fracture. Left hip arthroplasty is normally aligned. Vascular calcifications are seen within the pelvis. IMPRESSION: Superior right hip dislocation. Electronically Signed   By: Helyn Numbers M.D.   On: 07/04/2021 21:51    Procedures .Sedation  Date/Time: 07/04/2021 11:27 PM Performed by: Koleen Distance, MD Authorized by: Koleen Distance, MD   Consent:    Consent obtained:  Written   Consent given by:  Healthcare agent (daughter)   Risks discussed:  Allergic reaction, dysrhythmia, inadequate sedation, nausea, prolonged hypoxia resulting in organ damage, prolonged sedation necessitating reversal, respiratory compromise necessitating ventilatory assistance and intubation and vomiting   Alternatives discussed:  Analgesia without sedation and anxiolysis Universal protocol:    Immediately prior to procedure, a time out was called: yes     Patient identity confirmed:  Arm band Indications:    Procedure performed:  Dislocation reduction   Procedure necessitating sedation performed by:  Different physician Pre-sedation assessment:    Time since last food or drink:  3 hours   ASA classification: class 3 - patient with severe systemic disease     Mouth opening:  3 or more finger widths   Thyromental distance:  4 finger widths   Mallampati score:  II - soft palate, uvula, fauces visible   Neck mobility: normal     Pre-sedation assessments completed and reviewed: airway patency, cardiovascular function, hydration status, mental status, nausea/vomiting, pain level, respiratory function and temperature     Pre-sedation assessment completed:  07/04/2021 10:55 PM Immediate pre-procedure details:     Reassessment: Patient reassessed immediately prior to procedure     Reviewed: vital signs and NPO status     Verified: bag valve mask available, emergency equipment available, intubation equipment available, IV patency confirmed and oxygen available   Procedure details (see MAR for exact dosages):  Preoxygenation:  Nasal cannula   Sedation:  Propofol   Intended level of sedation: deep   Analgesia:  None   Intra-procedure monitoring:  Cardiac monitor, blood pressure monitoring, continuous capnometry, continuous pulse oximetry, frequent LOC assessments and frequent vital sign checks   Intra-procedure events: respiratory depression     Intra-procedure management:  Airway repositioning and BVM ventilation   Total Provider sedation time (minutes):  20 Post-procedure details:    Post-sedation assessment completed:  07/04/2021 11:30 PM   Attendance: Constant attendance by certified staff until patient recovered     Recovery: Patient returned to pre-procedure baseline     Post-sedation assessments completed and reviewed: airway patency, cardiovascular function, mental status, nausea/vomiting, respiratory function and temperature     Patient is stable for discharge or admission: yes     Procedure completion:  Tolerated with difficulty   Medications Ordered in ED Medications  propofol (DIPRIVAN) 10 mg/mL bolus/IV push 39.6 mg (has no administration in time range)  sodium chloride 0.9 % bolus 500 mL (has no administration in time range)  propofol (DIPRIVAN) 10 mg/mL bolus/IV push (39.6 mg Intravenous Given 07/04/21 2306)  HYDROmorphone (DILAUDID) injection 1 mg (1 mg Intravenous Given 07/04/21 2057)  ondansetron (ZOFRAN) injection 4 mg (4 mg Intravenous Given 07/04/21 2056)    ED Course  I have reviewed the triage vital signs and the nursing notes.  Pertinent labs & imaging results that were available during my care of the patient were reviewed by me and considered in my medical decision making  (see chart for details).  Clinical Course as of 07/05/21 1336  Sat Jul 04, 2021  2153 I spoke with PA Haus of [facility] regarding this patient. Will call back. [AW]  2204 Dr. Thomasena Edis with [facility] came in to reduce the hip given the patient's age and my need to be managing the sedation.  [AW]  2348 Patient and daughter updated.  Patient will need to have a urinalysis to ensure she does not have a UTI.  She will need to be able to be off oxygen which she was put on secondary to narcotic administration for her hip dislocation.  Finally, she will have to be able to ambulate with with the knee immobilizer. Dr. Judd Lien will follow. [AW]    Clinical Course User Index [AW] Koleen Distance, MD   MDM Rules/Calculators/A&P                           Joanette Gula Harkey presents after a fall.  She was evaluated for evidence of trauma.  She sustained a right periprosthetic hip dislocation.  This was reduced by orthopedics under moderate sedation performed by me.  No other evidence of trauma.  ED work-up otherwise unremarkable. Final Clinical Impression(s) / ED Diagnoses Final diagnoses:  Trauma  Dislocation of right hip, initial encounter [facility])    Rx / DC Orders ED Discharge Orders     None        Koleen Distance, MD 07/05/21 [phone]

## 2021-07-04 NOTE — Sedation Documentation (Signed)
*  reduced.

## 2021-07-04 NOTE — Discharge Instructions (Addendum)
The pt is to be admitted to SNF without a 3 night Qualified hospital stay under the current Medicare [place]  Wear knee immobilizer on right leg at all times. Weight bear as tolerated.  Please have follow up with Dr. Lequita Halt in 8 days- call for appointment

## 2021-07-04 NOTE — ED Triage Notes (Signed)
Pt to ED via EMS from [facility] c/o unwitnessed mechanical fall, pt right hip began to hurt and then she fell, obvious deformity to right, shortening and rotation noted. HX bilat hip replacements. Pt not on blood thinners, pt did not hit head. No LOC. Pt down aprox 1 hour prior to EMS arrival. HX Dementia,Orientation at bedside  HOH, HTN, DM. #18 LAC. 4 MG Zofran, 150 Fentanyl given by EMS. 170/84, hr 56, RR18, 96% 3L CBG 174

## 2021-07-05 ENCOUNTER — Other Ambulatory Visit: Payer: Self-pay

## 2021-07-05 DIAGNOSIS — T84020A Dislocation of internal right hip prosthesis, initial encounter: Secondary | ICD-10-CM | POA: Diagnosis not present

## 2021-07-05 LAB — URINALYSIS, ROUTINE W REFLEX MICROSCOPIC
Bilirubin Urine: NEGATIVE
Glucose, UA: NEGATIVE mg/dL
Leukocytes,Ua: NEGATIVE
Nitrite: NEGATIVE
Specific Gravity, Urine: 1.025 (ref 1.005–1.030)
pH: 6 (ref 5.0–8.0)

## 2021-07-05 MED ORDER — HYDROCODONE-ACETAMINOPHEN 5-325 MG PO TABS
1.0000 | ORAL_TABLET | Freq: Once | ORAL | Status: AC
  Administered 2021-07-05: 1 via ORAL
  Filled 2021-07-05: qty 1

## 2021-07-05 MED ORDER — ACETAMINOPHEN 325 MG PO TABS
650.0000 mg | ORAL_TABLET | Freq: Once | ORAL | Status: AC
  Administered 2021-07-05: 650 mg via ORAL
  Filled 2021-07-05: qty 2

## 2021-07-05 MED ORDER — LIDOCAINE 5 % EX PTCH
1.0000 | MEDICATED_PATCH | CUTANEOUS | Status: DC
  Administered 2021-07-05 – 2021-07-06 (×3): 1 via TRANSDERMAL
  Filled 2021-07-05 (×3): qty 1

## 2021-07-05 MED ORDER — MORPHINE SULFATE (PF) 2 MG/ML IV SOLN
2.0000 mg | Freq: Once | INTRAVENOUS | Status: AC
  Administered 2021-07-05: 2 mg via INTRAVENOUS
  Filled 2021-07-05: qty 1

## 2021-07-05 NOTE — NC FL2 (Signed)
[place] MEDICAID FL2 LEVEL OF CARE SCREENING TOOL     IDENTIFICATION  Patient Name: Melissa Harris Birthdate: [birth year] Sex: female Admission Date (Current Location): 07/04/2021  [facility] and [place] Number:  Producer, television/film/video and Address:  [facility],  [address] [place], [place] [address]      Provider Number: [id number]  Attending Physician Name and Address:  Default, Provider, MD  Relative Name and Phone Number:  Sherron Flemings [phone]    Current Level of Care: Hospital Recommended Level of Care: Skilled Nursing Facility Prior Approval Number:    Date Approved/Denied:   PASRR Number: [id number]  Discharge Plan: SNF    Current Diagnoses: Patient Active Problem List   Diagnosis Date Noted   OA (osteoarthritis) of hip 12/15/2016   OA (osteoarthritis) of knee 12/20/2014   Preop cardiovascular exam 11/28/2014   Palpitations 11/28/2014    Orientation RESPIRATION BLADDER Height & Weight     Self  Normal Continent Weight: 174 lb 11.2 oz (79.2 kg) Height:  5\' 7"  (170.2 cm)  BEHAVIORAL SYMPTOMS/MOOD NEUROLOGICAL BOWEL NUTRITION STATUS      Continent Diet (NPO (Time specific))  AMBULATORY STATUS COMMUNICATION OF NEEDS Skin   Limited Assist Verbally Normal                       Personal Care Assistance Level of Assistance  Bathing, Feeding, Dressing Bathing Assistance: Limited assistance Feeding assistance: Independent Dressing Assistance: Limited assistance     Functional Limitations Info             SPECIAL CARE FACTORS FREQUENCY  PT (By licensed PT), OT (By licensed OT)     PT Frequency: 5 times a week OT Frequency: 5 times a week            Contractures Contractures Info: Not present    Additional Factors Info  Code Status, Allergies Code Status Info: Full Code Allergies Info: NKA           Current Medications (07/05/2021):  This is the current hospital active medication list Current Facility-Administered  Medications  Medication Dose Route Frequency Provider Last Rate Last Admin   lidocaine (LIDODERM) 5 % 1 patch  1 patch Transdermal Q24H 07/07/2021, MD   1 patch at 07/05/21 0401   propofol (DIPRIVAN) 10 mg/mL bolus/IV push 39.6 mg  0.5 mg/kg Intravenous Once 07/07/21, MD       Current Outpatient Medications  Medication Sig Dispense Refill   acetaminophen (TYLENOL) 325 MG tablet Take 2 tablets (650 mg total) by mouth every 6 (six) hours as needed for mild pain (or Fever >/= 101). (Patient not taking: No sig reported) 40 tablet 0   acetaminophen (TYLENOL) 500 MG tablet Take 1,000 mg by mouth 2 (two) times daily.     bisacodyl (DULCOLAX) 10 MG suppository Place 1 suppository (10 mg total) rectally daily as needed for moderate constipation. (Patient not taking: No sig reported) 12 suppository 0   divalproex (DEPAKOTE) 125 MG DR tablet Take 125 mg by mouth 2 (two) times daily.     docusate sodium (COLACE) 100 MG capsule Take 1 capsule (100 mg total) by mouth 2 (two) times daily. (Patient not taking: No sig reported) 10 capsule 0   magnesium hydroxide (MILK OF MAGNESIA) 400 MG/5ML suspension Take 30 mLs by mouth every 6 (six) hours as needed for mild constipation.     memantine (NAMENDA) 5 MG tablet Take 5 mg by mouth 2 (two)  times daily.     methocarbamol (ROBAXIN) 500 MG tablet Take 1 tablet (500 mg total) by mouth every 6 (six) hours as needed for muscle spasms. (Patient not taking: No sig reported) 80 tablet 0   metoprolol tartrate (LOPRESSOR) 25 MG tablet Take 25-50 mg by mouth See admin instructions. Take 25 mg in the morning and 50 mg in the evening     omeprazole (PRILOSEC) 40 MG capsule Take 1 capsule (40 mg total) by mouth daily. (Patient not taking: Reported on 01/06/2021) 30 capsule 1   pantoprazole (PROTONIX) 40 MG tablet Take 40 mg by mouth 2 (two) times daily.     rivaroxaban (XARELTO) 10 MG TABS tablet Take 1 tablet (10 mg total) by mouth daily with breakfast. Take Xarelto for two  and a half more weeks following discharge from the hospital, then discontinue Xarelto. Once the patient has completed the blood thinner regimen, then take a Baby 81 mg Aspirin daily for three more weeks. (Patient not taking: No sig reported) 18 tablet 0   sertraline (ZOLOFT) 50 MG tablet Take 50 mg by mouth daily.     simethicone (MYLICON) 80 MG chewable tablet Chew 1 tablet (80 mg total) by mouth every 6 (six) hours as needed for flatulence. (Patient not taking: No sig reported) 30 tablet 0   sucralfate (CARAFATE) 1 g tablet Take 1 tablet (1 g total) by mouth 4 (four) times daily -  with meals and at bedtime. (Patient not taking: Reported on 01/06/2021) 80 tablet 0   traMADol (ULTRAM) 50 MG tablet Take 1-2 tablets (50-100 mg total) by mouth every 6 (six) hours as needed for moderate pain. (Patient not taking: No sig reported) 56 tablet 0     Discharge Medications: Please see discharge summary for a list of discharge medications.  Relevant Imaging Results:  Relevant Lab Results:   Additional Information SSN #: [id number]  Ht:  5\' 7"   Wt:  174 lbs  BMI:  27.36 kg/m2  Nicholis Stepanek C Tarpley-Carter, LCSWA

## 2021-07-05 NOTE — Progress Notes (Signed)
TOC CSW contacted [employer] [phone].  CSW spoke with Adelina Mings.  Adelina Mings stated there is a SNF side to [facility] that pt could return to until she is able to return to independent living.  Adelina Mings will have Keisha/Healthcare Director call CSW back.  Tomasz Steeves Tarpley-Carter, MSW, LCSW-A Pronouns:  She/Her/Hers [facility] of Care Clinical Social Worker Direct Number:  [phone] [email or web address]

## 2021-07-05 NOTE — Progress Notes (Signed)
TOC CSW contacted Drinda Butts Riffle/pts daughter [phone] in regards to pt possibly returning to [facility] for SNF.  Drinda Butts is in Insurance risk surveyor.  Wynnie Pacetti Tarpley-Carter, MSW, LCSW-A Pronouns:  She/Her/Hers [facility] Health[facility] of Care Clinical Social Worker Direct Number:  [phone] [email or web address]

## 2021-07-05 NOTE — Progress Notes (Signed)
TOC CSW spoke with pts daughter Sherron Flemings [phone].  Drinda Butts is in agreeance with sending pt information out for bed offers.    Alethia Melendrez Tarpley-Carter, MSW, LCSW-A Pronouns:  She/Her/Hers [facility] Health[facility] of Care Clinical Social Worker Direct Number:  [phone] [email or web address]

## 2021-07-05 NOTE — Evaluation (Signed)
Physical Therapy Evaluation Patient Details Name: Melissa Harris MRN: [id number] DOB: [birth year] Today's Date: 07/05/2021  History of Present Illness  Pt s/p fall with R hip dislocation and now s/p reduction.  Pt also with c/o of R shoulder pain and using arm minimally but no fx found on imaging.  Pt with hx of bil THR, bil TKR, DM, dementia, and HOH.  Pt admitted from facility but unclear if IND living or assisted living level  Clinical Impression  Pt admitted as above and presenting with functional mobility limitations 2* R LE deficits with KI in place, limited use of R UE 2* severe shoulder pain with movement, generalized weakness, balance deficits and dementia related cognitive deficits.  Pt would benefit from follow up rehab at SNF level to maximize IND and safety.     Recommendations for follow up therapy are one component of a multi-disciplinary discharge planning process, led by the attending physician.  Recommendations may be updated based on patient status, additional functional criteria and insurance authorization.  Follow Up Recommendations SNF    Equipment Recommendations  None recommended by PT    Recommendations for Other Services OT consult     Precautions / Restrictions Precautions Precautions: Fall;Posterior Hip Precaution Comments: Posterior THP assumed based on need for KI but no order for same in chart Required Braces or Orthoses: Knee Immobilizer - Right Knee Immobilizer - Right: On at all times (for next 3 wks) Restrictions Weight Bearing Restrictions: No      Mobility  Bed Mobility Overal bed mobility: Needs Assistance Bed Mobility: Supine to Sit;Sit to Supine;Rolling Rolling: Mod assist   Supine to sit: Min assist;Mod assist;+2 for physical assistance Sit to supine: Min assist;+2 for physical assistance;+2 for safety/equipment   General bed mobility comments: Increased time with cues for sequence and assist to manage R LE and to control trunk; rolling  side to side to reposition pad and sheets with increased assist 2* R shoulder pain and to follow THP on R.    Transfers                 General transfer comment: NT - pt agreeable to sitting up only  Ambulation/Gait                Stairs            Wheelchair Mobility    Modified Rankin (Stroke Patients Only)       Balance Overall balance assessment: Needs assistance Sitting-balance support: No upper extremity supported;Feet unsupported Sitting balance-Leahy Scale: Good                                       Pertinent Vitals/Pain Pain Assessment: Faces Faces Pain Scale: Hurts even more Pain Location: R shoulder - more painful than hip Pain Descriptors / Indicators: Aching;Grimacing;Guarding;Sore Pain Intervention(s): Limited activity within patient's tolerance;Monitored during session;Other (comment) (Lidocaine patch in place)    Home Living Family/patient expects to be discharged to:: Skilled nursing facility                      Prior Function           Comments: Unclear from pt - states she uses RW and then she doesn't and states only that she lives in [place] when questioned about current living arrangement     Hand Dominance        Extremity/Trunk  Assessment   Upper Extremity Assessment Upper Extremity Assessment: RUE deficits/detail;Generalized weakness RUE Deficits / Details: min movement tolerated R shoulder 2* pain    Lower Extremity Assessment Lower Extremity Assessment: RLE deficits/detail;Generalized weakness RLE Deficits / Details: In KI at all times per Dr order RLE: Unable to fully assess due to immobilization    Cervical / Trunk Assessment Cervical / Trunk Assessment: Kyphotic  Communication   Communication: HOH  Cognition Arousal/Alertness: Awake/alert Behavior During Therapy: Flat affect Overall Cognitive Status: History of cognitive impairments - at baseline                                         General Comments      Exercises     Assessment/Plan    PT Assessment Patient needs continued PT services  PT Problem List Decreased strength;Decreased range of motion;Decreased activity tolerance;Decreased balance;Decreased mobility;Decreased cognition;Decreased knowledge of use of DME;Pain       PT Treatment Interventions DME instruction;Gait training;Functional mobility training;Therapeutic activities;Therapeutic exercise;Balance training;Patient/family education    PT Goals (Current goals can be found in the Care Plan section)  Acute Rehab PT Goals Patient Stated Goal: Less pain PT Goal Formulation: Patient unable to participate in goal setting Time For Goal Achievement: 07/19/21 Potential to Achieve Goals: Fair    Frequency Min 3X/week   Barriers to discharge        Co-evaluation               AM-PAC PT "6 Clicks" Mobility  Outcome Measure Help needed turning from your back to your side while in a flat bed without using bedrails?: A Lot Help needed moving from lying on your back to sitting on the side of a flat bed without using bedrails?: A Lot Help needed moving to and from a bed to a chair (including a wheelchair)?: A Lot Help needed standing up from a chair using your arms (e.g., wheelchair or bedside chair)?: A Lot Help needed to walk in hospital room?: Total Help needed climbing 3-5 steps with a railing? : Total 6 Click Score: 10    End of Session Equipment Utilized During Treatment: Gait belt;Right knee immobilizer Activity Tolerance: Patient limited by fatigue;Patient limited by pain Patient left: in bed;with call bell/phone within reach   PT Visit Diagnosis: History of falling (Z91.81);Pain;Difficulty in walking, not elsewhere classified (R26.2) Pain - Right/Left: Right Pain - part of body: Shoulder;Hip    Time: 0942-1000 PT Time Calculation (min) (ACUTE ONLY): 18 min   Charges:   PT Evaluation $PT Eval Low  Complexity: 1 Low          Mauro Kaufmann PT Acute Rehabilitation Services Pager [phone] Office [phone]   Cashis Rill 07/05/2021, 12:55 PM

## 2021-07-05 NOTE — ED Provider Notes (Signed)
  Physical Exam  BP 117/62   Pulse 62   Temp 98.5 F (36.9 C)   Resp 18   Ht 5\' 7"  (1.702 m)   Wt 79.2 kg   SpO2 96%   BMI 27.36 kg/m   Physical Exam Vitals and nursing note reviewed.  Constitutional:      General: She is not in acute distress.    Appearance: Normal appearance. She is not ill-appearing.  HENT:     Head: Normocephalic and atraumatic.  Pulmonary:     Effort: Pulmonary effort is normal.  Musculoskeletal:        General: No deformity. Normal range of motion.  Skin:    General: Skin is warm and dry.  Neurological:     Mental Status: She is alert.    ED Course/Procedures   Clinical Course as of 07/05/21 0601  Sat Jul 04, 2021  2153 I spoke with PA Haus of [facility] regarding this patient. Will call back. [AW]  2204 Dr. 2205 with [facility] came in to reduce the hip given the patient's age and my need to be managing the sedation.  [AW]  2348 Patient and daughter updated.  Patient will need to have a urinalysis to ensure she does not have a UTI.  She will need to be able to be off oxygen which she was put on secondary to narcotic administration for her hip dislocation.  Finally, she will have to be able to ambulate with with the knee immobilizer. Dr. 2349 will follow. [AW]    Clinical Course User Index [AW] Judd Lien, MD    Procedures  MDM  Care assumed from Dr. Koleen Distance at shift change.  Patient brought here after a fall and dislocating her prosthetic hip.  Patient underwent conscious sedation and the hip was successfully reduced.  She has significant discomfort in other body parts, for which the imaging studies showed no acute fracture or other abnormality.  Patient is very sore and unable to sit up in bed much less ambulate.  She is currently in an independent living facility and I do not feel as though she is capable of returning to this environment.  I will consult PT as well as case management and social services to see about arrangements for  skilled nursing.       Delford Field, MD 07/05/21 [phone]

## 2021-07-05 NOTE — Progress Notes (Signed)
TOC CSW was contacted by Keisha/Healthcare Director [phone].  Deanna Artis stated that [facility] does not have a SNF.    Ali Mohl Tarpley-Carter, MSW, LCSW-A Pronouns:  She/Her/Hers [facility] HealthTransitions of Care Clinical Social Worker Direct Number:  [phone] [email or web address]

## 2021-07-06 ENCOUNTER — Emergency Department (HOSPITAL_COMMUNITY): Payer: Medicare Other

## 2021-07-06 DIAGNOSIS — T84020A Dislocation of internal right hip prosthesis, initial encounter: Secondary | ICD-10-CM | POA: Diagnosis not present

## 2021-07-06 MED ORDER — ACETAMINOPHEN 325 MG PO TABS
650.0000 mg | ORAL_TABLET | Freq: Once | ORAL | Status: AC
  Administered 2021-07-06: 650 mg via ORAL
  Filled 2021-07-06: qty 2

## 2021-07-06 NOTE — Progress Notes (Deleted)
BCBS auth needs to be obtained.  Deberah Adolf Tarpley-Carter, MSW, LCSW-A Pronouns:  She/Her/Hers [facility] Health[facility]itions of Care Clinical Social Worker Direct Number:  [phone] [email or web address]

## 2021-07-06 NOTE — Progress Notes (Signed)
CSW contacted [facility] for pt transfer to [facility] rm #[id number], call reported to [phone]. CSW contacted pt's daughter an informed her of pt's transfer.  Valentina Shaggy.Jaeline Whobrey, MSW, LCSWA [facility]  Transitions of Care Clinical Social Worker I Direct Dial: [phone]  Fax: [phone] [email or web address]

## 2021-07-06 NOTE — ED Provider Notes (Addendum)
  Physical Exam  BP 138/66   Pulse 80   Temp 98.5 F (36.9 C)   Resp 16   Ht 1.702 m (5\' 7" )   Wt 79.2 kg   SpO2 99%   BMI 27.36 kg/m   Physical Exam  ED Course/Procedures   Clinical Course as of 07/06/21 1241  Sat Jul 04, 2021  2153 I spoke with PA Haus of [facility] regarding this patient. Will call back. [AW]  2204 Dr. 2205 with [facility] came in to reduce the hip given the patient's age and my need to be managing the sedation.  [AW]  2348 Patient and daughter updated.  Patient will need to have a urinalysis to ensure she does not have a UTI.  She will need to be able to be off oxygen which she was put on secondary to narcotic administration for her hip dislocation.  Finally, she will have to be able to ambulate with with the knee immobilizer. Dr. 2349 will follow. [AW]    Clinical Course User Index [AW] Judd Lien, MD    Procedures  MDM  87 yo female on palliative care presents from alf after fall with hip dislocation and generalized pain.  X-rays done included hip (dislocated and reduced in ED), right shouder wrist, and elbow- no fractures, head and neck ct- no acute abnormality noted She has been boarded in ED as unable to walk Labs normal Covid negative On exam VSS Patient does not know where she is or date Diffuse back pain on exam CT thoracic and lumbar spine added.   CT back and lumbar spine and MRI normal. Patient has been accepted at [facility] and will be discharged     [facility], MD 07/06/21 1241    07/08/21, MD 07/06/21 1242

## 2021-07-06 NOTE — Progress Notes (Signed)
Pt's has Medicare A and B and will not require auth. CSW spoke with pt's daughter and provided bed offers , pt's daughter requested to talk it over with brother and will let CSW know of decision with in the hour. CSW to continue to follow.  Valentina Shaggy.Felissa Blouch, MSW, LCSWA [facility]  Transitions of Care Clinical Social Worker I Direct Dial: [phone]  Fax: [phone] [email or web address]

## 2021-07-17 ENCOUNTER — Encounter (HOSPITAL_COMMUNITY): Payer: Self-pay

## 2021-07-17 ENCOUNTER — Emergency Department (HOSPITAL_COMMUNITY): Payer: Medicare Other

## 2021-07-17 ENCOUNTER — Emergency Department (HOSPITAL_COMMUNITY)
Admission: EM | Admit: 2021-07-17 | Discharge: 2021-07-17 | Disposition: A | Discharge status: Home / Self Care | Payer: Medicare Other | Attending: Emergency Medicine | Admitting: Emergency Medicine

## 2021-07-17 DIAGNOSIS — S01111A Laceration without foreign body of right eyelid and periocular area, initial encounter: Secondary | ICD-10-CM | POA: Insufficient documentation

## 2021-07-17 DIAGNOSIS — S40021A Contusion of right upper arm, initial encounter: Secondary | ICD-10-CM

## 2021-07-17 DIAGNOSIS — S0181XA Laceration without foreign body of other part of head, initial encounter: Secondary | ICD-10-CM

## 2021-07-17 DIAGNOSIS — Z96643 Presence of artificial hip joint, bilateral: Secondary | ICD-10-CM | POA: Insufficient documentation

## 2021-07-17 DIAGNOSIS — S40011A Contusion of right shoulder, initial encounter: Secondary | ICD-10-CM | POA: Insufficient documentation

## 2021-07-17 DIAGNOSIS — W1839XA Other fall on same level, initial encounter: Secondary | ICD-10-CM | POA: Insufficient documentation

## 2021-07-17 DIAGNOSIS — M19019 Primary osteoarthritis, unspecified shoulder: Secondary | ICD-10-CM

## 2021-07-17 DIAGNOSIS — S0990XA Unspecified injury of head, initial encounter: Secondary | ICD-10-CM

## 2021-07-17 DIAGNOSIS — Z96653 Presence of artificial knee joint, bilateral: Secondary | ICD-10-CM | POA: Diagnosis not present

## 2021-07-17 DIAGNOSIS — I10 Essential (primary) hypertension: Secondary | ICD-10-CM | POA: Insufficient documentation

## 2021-07-17 DIAGNOSIS — Z79899 Other long term (current) drug therapy: Secondary | ICD-10-CM | POA: Insufficient documentation

## 2021-07-17 DIAGNOSIS — W19XXXA Unspecified fall, initial encounter: Secondary | ICD-10-CM

## 2021-07-17 LAB — URINALYSIS, ROUTINE W REFLEX MICROSCOPIC
Bacteria, UA: NONE SEEN
Bilirubin Urine: NEGATIVE
Glucose, UA: NEGATIVE mg/dL
Ketones, ur: NEGATIVE mg/dL
Leukocytes,Ua: NEGATIVE
Nitrite: NEGATIVE
Protein, ur: NEGATIVE mg/dL
Specific Gravity, Urine: 1.011 (ref 1.005–1.030)
pH: 7 (ref 5.0–8.0)

## 2021-07-17 NOTE — ED Provider Notes (Signed)
Emergency Medicine Provider Triage Evaluation Note  Melissa Harris , a 87 y.o. female  was evaluated in triage.  Pt complains of fall.  She cannot give any additional history due to dementia.  Recent hip dislocation, reduced in the ED.  She is anticoagulated with Xarelto.  Review of Systems  Positive: Injury to face Negative: No chest pain or shortness of breath  Physical Exam  BP (!) 172/71 (BP Location: Right Arm)   Pulse 62   Temp 98 F (36.7 C) (Oral)   Resp 18   SpO2 96%  Gen:   Awake, no distress wearing cervical collar, applied by EMS Resp:  Normal effort  MSK:   Moves extremities without difficulty guards against moving right shoulder secondary to pain Other:  Alert and conversant but confused  Medical Decision Making  Medically screening exam initiated at 9:59 AM.  Appropriate orders placed.  [name] was informed that the remainder of the evaluation will be completed by another provider, this initial triage assessment does not replace that evaluation, and the importance of remaining in the ED until their evaluation is complete.  Fall, etiology not clear.   Mancel Bale, MD 07/17/21 1000

## 2021-07-17 NOTE — ED Provider Notes (Signed)
[facility]-EMERGENCY DEPT Provider Note   CSN: [id number] Arrival date & time: 07/17/21  0930     History Chief Complaint  Patient presents with   Melissa Harris    Melissa Harris is a 87 y.o. female.  HPI [name] , a 87 y.o. female  was evaluated in triage, initial.  Pt complains of fall.  She cannot give any additional history due to dementia.  Recent hip dislocation, reduced in the ED.  She is anticoagulated with Xarelto.  Level 5 caveat-dementia    Past Medical History:  Diagnosis Date   Carpal tunnel syndrome    left    Depression    GERD (gastroesophageal reflux disease)    Hypertension    Nocturia    Osteoarthritis     Patient Active Problem List   Diagnosis Date Noted   OA (osteoarthritis) of hip 12/15/2016   OA (osteoarthritis) of knee 12/20/2014   Preop cardiovascular exam 11/28/2014   Palpitations 11/28/2014    Past Surgical History:  Procedure Laterality Date   APPENDECTOMY     CATARACT EXTRACTION     CHOLECYSTECTOMY     TONSILLECTOMY AND ADENOIDECTOMY     TOTAL HIP ARTHROPLASTY Right    TOTAL HIP ARTHROPLASTY Left 12/15/2016   Procedure: LEFT TOTAL HIP ARTHROPLASTY ANTERIOR APPROACH;  Surgeon: Ollen Gross, MD;  Location: [facility];  Service: Orthopedics;  Laterality: Left;  requests   TOTAL KNEE ARTHROPLASTY Right    TOTAL KNEE ARTHROPLASTY Left 12/20/2014   Procedure: LEFT TOTAL KNEE ARTHROPLASTY;  Surgeon: Ollen Gross, MD;  Location: [facility];  Service: Orthopedics;  Laterality: Left;     OB History   No obstetric history on file.     Family History  Problem Relation Age of Onset   Dementia Other        Died age 38    Social History   Tobacco Use   Smoking status: Never   Smokeless tobacco: Never  Substance Use Topics   Alcohol use: No    Alcohol/week: 0.0 standard drinks   Drug use: No    Home Medications Prior to Admission medications   Medication Sig Start Date End Date Taking? Authorizing Provider   acetaminophen (TYLENOL) 325 MG tablet Take 2 tablets (650 mg total) by mouth every 6 (six) hours as needed for mild pain (or Fever >/= 101). 12/23/14  Yes Perkins, Alexzandrew L, PA-C  acetaminophen (TYLENOL) 500 MG tablet Take 1,000 mg by mouth 2 (two) times daily.   Yes [provider]  bisacodyl (DULCOLAX) 10 MG suppository Place 1 suppository (10 mg total) rectally daily as needed for moderate constipation. 12/23/14  Yes Perkins, Alexzandrew L, PA-C  diclofenac Sodium (VOLTAREN) 1 % GEL Apply 2 g topically in the morning, at noon, and at bedtime. For 7 days 07/16/21 07/23/21 Yes [provider]  divalproex (DEPAKOTE) 125 MG DR tablet Take 125 mg by mouth 2 (two) times daily.   Yes [provider]  docusate sodium (COLACE) 100 MG capsule Take 1 capsule (100 mg total) by mouth 2 (two) times daily. 12/23/14  Yes Perkins, Alexzandrew L, PA-C  magnesium hydroxide (MILK OF MAGNESIA) 400 MG/5ML suspension Take 30 mLs by mouth every 6 (six) hours as needed for mild constipation.   Yes [provider]  memantine (NAMENDA) 5 MG tablet Take 5 mg by mouth 2 (two) times daily.   Yes [provider]  methocarbamol (ROBAXIN) 500 MG tablet Take 1 tablet (500 mg total) by  mouth every 6 (six) hours as needed for muscle spasms. Patient taking differently: Take 500 mg by mouth every 8 (eight) hours as needed for muscle spasms. 12/17/16  Yes Perkins, Alexzandrew L, PA-C  metoprolol tartrate (LOPRESSOR) 25 MG tablet Take 25 mg by mouth daily.   Yes [provider]  metoprolol tartrate (LOPRESSOR) 50 MG tablet Take 50 mg by mouth at bedtime.   Yes [provider]  pantoprazole (PROTONIX) 40 MG tablet Take 40 mg by mouth 2 (two) times daily.   Yes [provider]  rivaroxaban (XARELTO) 10 MG TABS tablet Take 1 tablet (10 mg total) by mouth daily with breakfast. Take Xarelto for two and a half more weeks following discharge from the hospital, then  discontinue Xarelto. Once the patient has completed the blood thinner regimen, then take a Baby 81 mg Aspirin daily for three more weeks. Patient taking differently: Take 10 mg by mouth daily. Once the patient has completed the blood thinner regimen, then take a Baby 81 mg Aspirin daily for three more weeks. 12/18/16  Yes Perkins, Alexzandrew L, PA-C  sertraline (ZOLOFT) 50 MG tablet Take 50 mg by mouth daily.   Yes [provider]  simethicone (MYLICON) 80 MG chewable tablet Chew 1 tablet (80 mg total) by mouth every 6 (six) hours as needed for flatulence. 11/24/17  Yes Casey Burkitt, MD  Sodium Phosphates (RA SALINE ENEMA RE) Place 1 Bottle rectally daily as needed (severe constipation).   Yes [provider]  sucralfate (CARAFATE) 1 g tablet Take 1 tablet (1 g total) by mouth 4 (four) times daily -  with meals and at bedtime. 01/07/20  Yes Derwood Kaplan, MD    Allergies    Patient has no known allergies.  Review of Systems   Review of Systems  Physical Exam Updated Vital Signs BP (!) 165/67 (BP Location: Right Arm)   Pulse 61   Temp 98 F (36.7 C) (Oral)   Resp 18   SpO2 98%   Physical Exam  ED Results / Procedures / Treatments   Labs (all labs ordered are listed, but only abnormal results are displayed) Labs Reviewed  URINALYSIS, ROUTINE W REFLEX MICROSCOPIC - Abnormal; Notable for the following components:      Result Value   Hgb urine dipstick MODERATE (*)    All other components within normal limits    EKG None  Radiology DG Chest 1 View  Result Date: 07/17/2021 CLINICAL DATA:  Unwitnessed mechanical fall. Pain to right shoulder and right hip. EXAM: CHEST  1 VIEW COMPARISON:  Chest radiograph 07/04/2021 FINDINGS: Thin linear scarring at the left heart border is stable. No new focal consolidation, pleural effusion, or pneumothorax. Mildly enlarged cardiac silhouette which may be related to patient positioning and portable technique. Aortic  calcifications. No acute osseous abnormality. Degenerative changes at the right glenohumeral joint. IMPRESSION: No acute cardiopulmonary or osseous abnormality. Electronically Signed   By: Sherron Ales M.D.   On: 07/17/2021 11:05   DG Pelvis 1-2 Views  Result Date: 07/17/2021 CLINICAL DATA:  Mechanical fall. Laceration forehead. Right eye swelling. EXAM: PELVIS - 1 VIEW COMPARISON:  One-view pelvis 12/15/2016 FINDINGS: Bilateral total hip arthroplasty noted. Distal aspect of the femoral component on the right is not imaged. No acute fractures are present. Degenerative changes again noted in the lower lumbar spine. IMPRESSION: 1. No acute abnormality. 2. Right total hip arthroplasty. Electronically Signed   By: Marin Roberts M.D.   On: 07/17/2021 11:02  DG Shoulder Right  Result Date: 07/17/2021 CLINICAL DATA:  Fall. EXAM: RIGHT SHOULDER - 2+ VIEW COMPARISON:  One-view chest x-ray 07/04/2021 FINDINGS: Right shoulder is located. No acute fractures are present. Advanced degenerative changes present. Loose bodies are suspected. Atherosclerotic changes are noted at the aortic arch. IMPRESSION: 1. No acute abnormality. 2. Advanced degenerative changes of the right shoulder. 3. Atherosclerosis. Electronically Signed   By: Marin Roberts M.D.   On: 07/17/2021 11:12   CT Head Wo Contrast  Result Date: 07/17/2021 CLINICAL DATA:  Trauma.  Unwitnessed fall. EXAM: CT HEAD WITHOUT CONTRAST CT CERVICAL SPINE WITHOUT CONTRAST TECHNIQUE: Multidetector CT imaging of the head and cervical spine was performed following the standard protocol without intravenous contrast. Multiplanar CT image reconstructions of the cervical spine were also generated. COMPARISON:  CT head and cervical spine 07/04/2021 FINDINGS: CT HEAD FINDINGS Brain: No acute infarct, hemorrhage, or mass lesion is present. The ventricles are of normal size. Moderate atrophy and white matter changes are stable. Basal ganglia are intact. No  significant extraaxial fluid collection is present. Vascular: Atherosclerotic calcifications are present within the cavernous internal carotid arteries. No hyperdense vessel is present. Skull: Calvarium is intact. No focal lytic or blastic lesions are present. No significant extracranial soft tissue lesion is present. Sinuses/Orbits: Right supraorbital soft tissue swelling is present. No underlying fracture is present. Bilateral lens replacements are noted. Globes and orbits are otherwise unremarkable. The paranasal sinuses and mastoid air cells are clear. CT CERVICAL SPINE FINDINGS Alignment: Grade 1 anterolisthesis at C5-6 is stable. Slight anterolisthesis at C6-7 and C7-T1 is also stable. Skull base and vertebrae: Marked degenerative changes are present C1-2. Craniocervical junction is otherwise normal. Skull base is within normal limits. Soft tissues and spinal canal: Minimal vascular calcifications are again seen. Soft tissues the neck are unremarkable. Disc levels: Multilevel disc disease is stable. A broad-based disc osteophyte complex at C4-5 results in moderate central and severe bilateral foraminal stenosis. Uncovertebral and facet disease at C5-6 results in bilateral foraminal stenosis. Upper chest: Lung apices are clear. Thoracic inlet is within normal limits. IMPRESSION: 1. Right supraorbital soft tissue swelling without underlying fracture. 2. Stable atrophy and white matter disease. This likely reflects the sequela of chronic microvascular ischemia. 3. Stable multilevel degenerative changes of the cervical spine without acute fracture or traumatic subluxation. Electronically Signed   By: Marin Roberts M.D.   On: 07/17/2021 10:56   CT Cervical Spine Wo Contrast  Result Date: 07/17/2021 CLINICAL DATA:  Trauma.  Unwitnessed fall. EXAM: CT HEAD WITHOUT CONTRAST CT CERVICAL SPINE WITHOUT CONTRAST TECHNIQUE: Multidetector CT imaging of the head and cervical spine was performed following the  standard protocol without intravenous contrast. Multiplanar CT image reconstructions of the cervical spine were also generated. COMPARISON:  CT head and cervical spine 07/04/2021 FINDINGS: CT HEAD FINDINGS Brain: No acute infarct, hemorrhage, or mass lesion is present. The ventricles are of normal size. Moderate atrophy and white matter changes are stable. Basal ganglia are intact. No significant extraaxial fluid collection is present. Vascular: Atherosclerotic calcifications are present within the cavernous internal carotid arteries. No hyperdense vessel is present. Skull: Calvarium is intact. No focal lytic or blastic lesions are present. No significant extracranial soft tissue lesion is present. Sinuses/Orbits: Right supraorbital soft tissue swelling is present. No underlying fracture is present. Bilateral lens replacements are noted. Globes and orbits are otherwise unremarkable. The paranasal sinuses and mastoid air cells are clear. CT CERVICAL SPINE FINDINGS Alignment: Grade 1 anterolisthesis at C5-6 is stable. Slight  anterolisthesis at C6-7 and C7-T1 is also stable. Skull base and vertebrae: Marked degenerative changes are present C1-2. Craniocervical junction is otherwise normal. Skull base is within normal limits. Soft tissues and spinal canal: Minimal vascular calcifications are again seen. Soft tissues the neck are unremarkable. Disc levels: Multilevel disc disease is stable. A broad-based disc osteophyte complex at C4-5 results in moderate central and severe bilateral foraminal stenosis. Uncovertebral and facet disease at C5-6 results in bilateral foraminal stenosis. Upper chest: Lung apices are clear. Thoracic inlet is within normal limits. IMPRESSION: 1. Right supraorbital soft tissue swelling without underlying fracture. 2. Stable atrophy and white matter disease. This likely reflects the sequela of chronic microvascular ischemia. 3. Stable multilevel degenerative changes of the cervical spine without  acute fracture or traumatic subluxation. Electronically Signed   By: Marin Roberts M.D.   On: 07/17/2021 10:56    Procedures .Marland KitchenLaceration Repair  Date/Time: 07/17/2021 1:14 PM Performed by: Mancel Bale, MD Authorized by: Mancel Bale, MD   Consent:    Consent obtained:  Verbal and emergent situation   Risks discussed:  Infection and pain Universal protocol:    Patient identity confirmed:  Verbally with patient Anesthesia:    Anesthesia method:  None Laceration details:    Location:  Face   Face location:  R eyebrow   Length (cm):  2   Depth (mm):  7 Pre-procedure details:    Preparation:  Patient was prepped and draped in usual sterile fashion Exploration:    Hemostasis achieved with:  Direct pressure   Imaging outcome: foreign body not noted     Wound exploration: wound explored through full range of motion     Wound extent: no fascia violation noted and no muscle damage noted     Contaminated: no   Treatment:    Area cleansed with:  Saline   Amount of cleaning:  Standard   Debridement:  None   Undermining:  None Skin repair:    Repair method:  Tissue adhesive Approximation:    Approximation:  Loose Post-procedure details:    Procedure completion:  Tolerated well, no immediate complications   Medications Ordered in ED Medications - No data to display  ED Course  I have reviewed the triage vital signs and the nursing notes.  Pertinent labs & imaging results that were available during my care of the patient were reviewed by me and considered in my medical decision making (see chart for details).  Clinical Course as of 07/17/21 1314  Fri Jul 17, 2021  1247 I discussed the patient's findings and care with her daughter, Drinda Butts, by telephone.  I answered all her questions.  She states the patient has ongoing right shoulder pain, felt to be from arthritis.  I discussed the x-ray findings that were consistent with that. [EW]    Clinical Course User  Index [EW] Mancel Bale, MD   MDM Rules/Calculators/A&P                            Patient Vitals for the past 24 hrs:  BP Temp Temp src Pulse Resp SpO2  07/17/21 1131 (!) 165/67 -- -- 61 18 98 %  07/17/21 1112 (!) 156/65 -- -- 63 -- --  07/17/21 0940 (!) 172/71 98 F (36.7 C) Oral 62 18 96 %    1:16 PM Reevaluation with update and discussion. After initial assessment and treatment, an updated evaluation reveals she remains fairly comfortable.  Findings discussed and  questions answered. Mancel Bale   Medical Decision Making:  This patient is presenting for evaluation of fall at nursing care facility, which does require a range of treatment options, and is a complaint that involves a moderate risk of morbidity and mortality. The differential diagnoses include contusion, head injury, acute illness. I decided to review old records, and in summary elderly female, unable to give history presents for evaluation of fall.  Recent hip dislocation reduced in the ED.  I obtained additional historical information from daughter by telephone.  Daughter states patient has had falls, causing hip dislocation in the 1 today, but no other chronic falls.  Patient has otherwise been well..  Clinical Laboratory Tests Ordered, included Urinalysis. Review indicates normal findings. Radiologic Tests Ordered, included CT head, CT cervical spine.  I independently Visualized: Radiographic images, which show no acute abnormality   Critical Interventions-clinical evaluation, laboratory testing, radiologic imaging, observation, laceration repair, discussion with family member  After These Interventions, the Patient was reevaluated and was found stable for discharge.  No further care for the wound needed.  Doubt serious intracranial injury.  Doubt cervical spine injury or myelopathy.  Urinalysis is reassuring for lack of acute medical disorder.  CRITICAL CARE-no Performed by: Mancel Bale  Nursing Notes  Reviewed/ Care Coordinated Applicable Imaging Reviewed Interpretation of Laboratory Data incorporated into ED treatment  The patient appears reasonably screened and/or stabilized for discharge and I doubt any other medical condition or other [facility] requiring further screening, evaluation, or treatment in the ED at this time prior to discharge.  Plan: Home Medications-continue usual and use Tylenol for pain; Home Treatments-wound care; return here if the recommended treatment, does not improve the symptoms; Recommended follow up-PCP, PRN     Final Clinical Impression(s) / ED Diagnoses Final diagnoses:  Fall, initial encounter  Injury of head, initial encounter  Contusion of multiple sites of right shoulder and upper arm, initial encounter  Arthritis pain of shoulder  Facial laceration, initial encounter    Rx / DC Orders ED Discharge Orders     None        Mancel Bale, MD 07/17/21 1318

## 2021-07-17 NOTE — ED Triage Notes (Signed)
Pt arrived via EMS, from [facility] unwitnessed assisted living, mechanical fall. Laceration to forehead. Right eye swelling. Pain to right shoulder/hip. Hx of dementia.   C collar

## 2021-07-17 NOTE — ED Notes (Signed)
PT TRANSPORTED TO CT AND XRAY VIA STRETCHER

## 2021-07-17 NOTE — Discharge Instructions (Signed)
Use ice on sore spots today and tomorrow.  Take Tylenol for pain.  The wound of her right eyebrow area was closed with wound adhesive.  The wound should heal up in 4 to 5 days.  No specific wound care is needed at this time.

## 2021-11-25 ENCOUNTER — Telehealth: Payer: Self-pay

## 2021-11-25 NOTE — Telephone Encounter (Signed)
Phone call placed to facility to check in on patient. Unable to speak with anyone. VM left

## 2021-11-26 ENCOUNTER — Non-Acute Institutional Stay: Payer: Medicare Other

## 2021-11-26 DIAGNOSIS — Z515 Encounter for palliative care: Secondary | ICD-10-CM

## 2021-11-27 ENCOUNTER — Other Ambulatory Visit: Payer: Self-pay

## 2021-11-28 ENCOUNTER — Emergency Department (HOSPITAL_COMMUNITY)
Admission: EM | Admit: 2021-11-28 | Discharge: 2021-11-29 | Disposition: A | Discharge status: Home / Self Care | Payer: Medicare Other | Attending: Emergency Medicine | Admitting: Emergency Medicine

## 2021-11-28 ENCOUNTER — Encounter (HOSPITAL_COMMUNITY): Payer: Self-pay | Admitting: Emergency Medicine

## 2021-11-28 ENCOUNTER — Emergency Department (HOSPITAL_COMMUNITY): Payer: Medicare Other

## 2021-11-28 ENCOUNTER — Other Ambulatory Visit: Payer: Self-pay

## 2021-11-28 DIAGNOSIS — T84020A Dislocation of internal right hip prosthesis, initial encounter: Secondary | ICD-10-CM | POA: Diagnosis not present

## 2021-11-28 DIAGNOSIS — F0390 Unspecified dementia without behavioral disturbance: Secondary | ICD-10-CM | POA: Diagnosis not present

## 2021-11-28 DIAGNOSIS — W19XXXA Unspecified fall, initial encounter: Secondary | ICD-10-CM

## 2021-11-28 DIAGNOSIS — W010XXA Fall on same level from slipping, tripping and stumbling without subsequent striking against object, initial encounter: Secondary | ICD-10-CM | POA: Insufficient documentation

## 2021-11-28 DIAGNOSIS — Z96643 Presence of artificial hip joint, bilateral: Secondary | ICD-10-CM | POA: Diagnosis not present

## 2021-11-28 DIAGNOSIS — S79911A Unspecified injury of right hip, initial encounter: Secondary | ICD-10-CM | POA: Diagnosis present

## 2021-11-28 DIAGNOSIS — D72829 Elevated white blood cell count, unspecified: Secondary | ICD-10-CM | POA: Insufficient documentation

## 2021-11-28 DIAGNOSIS — Y92121 Bathroom in nursing home as the place of occurrence of the external cause: Secondary | ICD-10-CM | POA: Diagnosis not present

## 2021-11-28 DIAGNOSIS — S73034A Other anterior dislocation of right hip, initial encounter: Secondary | ICD-10-CM

## 2021-11-28 DIAGNOSIS — E876 Hypokalemia: Secondary | ICD-10-CM | POA: Diagnosis not present

## 2021-11-28 LAB — CBC WITH DIFFERENTIAL/PLATELET
Abs Immature Granulocytes: 0.34 10*3/uL — ABNORMAL HIGH (ref 0.00–0.07)
Basophils Absolute: 0.1 10*3/uL (ref 0.0–0.1)
Basophils Relative: 1 %
Eosinophils Absolute: 0.2 10*3/uL (ref 0.0–0.5)
Eosinophils Relative: 1 %
HCT: 41.5 % (ref 36.0–46.0)
Hemoglobin: 13.5 g/dL (ref 12.0–15.0)
Immature Granulocytes: 3 %
Lymphocytes Relative: 10 %
Lymphs Abs: 1.3 10*3/uL (ref 0.7–4.0)
MCH: 32.2 pg (ref 26.0–34.0)
MCHC: 32.5 g/dL (ref 30.0–36.0)
MCV: 99 fL (ref 80.0–100.0)
Monocytes Absolute: 1.2 10*3/uL — ABNORMAL HIGH (ref 0.1–1.0)
Monocytes Relative: 9 %
Neutro Abs: 10.2 10*3/uL — ABNORMAL HIGH (ref 1.7–7.7)
Neutrophils Relative %: 76 %
Platelets: 333 10*3/uL (ref 150–400)
RBC: 4.19 MIL/uL (ref 3.87–5.11)
RDW: 14.6 % (ref 11.5–15.5)
WBC: 13.3 10*3/uL — ABNORMAL HIGH (ref 4.0–10.5)
nRBC: 0 % (ref 0.0–0.2)

## 2021-11-28 LAB — BASIC METABOLIC PANEL
Anion gap: 10 (ref 5–15)
BUN: 12 mg/dL (ref 8–23)
CO2: 29 mmol/L (ref 22–32)
Calcium: 8.1 mg/dL — ABNORMAL LOW (ref 8.9–10.3)
Chloride: 96 mmol/L — ABNORMAL LOW (ref 98–111)
Creatinine, Ser: 1 mg/dL (ref 0.44–1.00)
GFR, Estimated: 53 mL/min — ABNORMAL LOW (ref >60)
Glucose, Bld: 129 mg/dL — ABNORMAL HIGH (ref 70–99)
Potassium: 2.3 mmol/L — CL (ref 3.5–5.1)
Sodium: 135 mmol/L (ref 135–145)

## 2021-11-28 MED ORDER — POTASSIUM CHLORIDE 10 MEQ/100ML IV SOLN
10.0000 meq | Freq: Once | INTRAVENOUS | Status: AC
  Administered 2021-11-28: 10 meq via INTRAVENOUS
  Filled 2021-11-28: qty 100

## 2021-11-28 MED ORDER — POTASSIUM CHLORIDE 10 MEQ/100ML IV SOLN
10.0000 meq | Freq: Once | INTRAVENOUS | Status: AC
Start: 2021-11-28 — End: 2021-11-28
  Administered 2021-11-28: 10 meq via INTRAVENOUS
  Filled 2021-11-28: qty 100

## 2021-11-28 MED ORDER — PROPOFOL 10 MG/ML IV BOLUS
40.0000 mg | Freq: Once | INTRAVENOUS | Status: AC
  Administered 2021-11-28: 40 mg via INTRAVENOUS
  Filled 2021-11-28: qty 20

## 2021-11-28 MED ORDER — SODIUM CHLORIDE 0.9 % IV BOLUS
500.0000 mL | Freq: Once | INTRAVENOUS | Status: AC
  Administered 2021-11-28: 500 mL via INTRAVENOUS

## 2021-11-28 MED ORDER — POTASSIUM CHLORIDE ER 10 MEQ PO TBCR: 10.0000 meq | EXTENDED_RELEASE_TABLET | Freq: Every day | ORAL | 0 refills | Status: AC

## 2021-11-28 MED ORDER — MORPHINE SULFATE (PF) 2 MG/ML IV SOLN
2.0000 mg | Freq: Once | INTRAVENOUS | Status: AC
  Administered 2021-11-28: 2 mg via INTRAVENOUS
  Filled 2021-11-28: qty 1

## 2021-11-28 NOTE — Discharge Instructions (Addendum)
Follow-up with your orthopedic surgeon this week.  Wear the knee immobilizer until you see the orthopedic surgeon.  Ambulate only with assistance until seen by orthopedics Follow-up with your family doctor to check your potassium in the next 2 weeks.

## 2021-11-28 NOTE — Progress Notes (Signed)
Assisted with conscious sedation- Co2 ranged from 14-21. RT utilized AMBU bag at 15 LPM for approximately 3 minutes for low Sp02. PT is now on 3 LPM supplemental 02 with Sp02 97%. RN is at bedside.

## 2021-11-28 NOTE — ED Notes (Signed)
Patient's daughter contacted and updated on d/c status.

## 2021-11-28 NOTE — ED Notes (Signed)
[facility] updated via phone.

## 2021-11-28 NOTE — Progress Notes (Signed)
Orthopedic Tech Progress Note Patient Details:  Melissa Harris January 22, 1928 [id number]  Ortho Devices Type of Ortho Device: Knee Immobilizer Ortho Device/Splint Location: right hip reduction Ortho Device/Splint Interventions: Application   Post Interventions Patient Tolerated: Well Instructions Provided: Care of device, Adjustment of device  Saul Fordyce 11/28/2021, 7:27 PM

## 2021-11-28 NOTE — ED Notes (Signed)
K 2.3, Zammit MD notified.

## 2021-11-28 NOTE — ED Provider Notes (Signed)
[facility]-EMERGENCY DEPT Provider Note   CSN: [id number] Arrival date & time: 11/28/21  1522     History  Chief Complaint  Patient presents with   Melissa Harris is a 88 y.o. female.  Patient was ambulating and she was being assisted when they heard a pop and her leg gave out.  Patient has history of dementia and has bilateral hip replacement  The history is provided by the patient and the nursing home. No language interpreter was used.  Fall This is a new problem. The current episode started 6 to 12 hours ago. The problem occurs rarely. The problem has been resolved. Pertinent negatives include no chest pain. Nothing aggravates the symptoms. Nothing relieves the symptoms. The treatment provided no relief.      Home Medications Prior to Admission medications   Medication Sig Start Date End Date Taking? Authorizing Provider  acetaminophen (TYLENOL) 325 MG tablet Take 2 tablets (650 mg total) by mouth every 6 (six) hours as needed for mild pain (or Fever >/= 101). 12/23/14  Yes Perkins, Alexzandrew L, PA-C  acetaminophen (TYLENOL) 500 MG tablet Take 1,000 mg by mouth 2 (two) times daily.   Yes [provider]  cholecalciferol (VITAMIN D) 25 MCG (1000 UNIT) tablet Take 3,000 Units by mouth daily.   Yes [provider]  divalproex (DEPAKOTE) 125 MG DR tablet Take 125 mg by mouth 2 (two) times daily.   Yes [provider]  docusate sodium (COLACE) 100 MG capsule Take 1 capsule (100 mg total) by mouth 2 (two) times daily. 12/23/14  Yes Perkins, Alexzandrew L, PA-C  memantine (NAMENDA) 5 MG tablet Take 5 mg by mouth 2 (two) times daily.   Yes [provider]  metoprolol tartrate (LOPRESSOR) 25 MG tablet Take 25-50 mg by mouth See admin instructions. Take 25mg  by mouth every morning and  50mg  every evening   Yes [provider]  pantoprazole (PROTONIX) 40 MG tablet Take 40 mg by mouth 2 (two) times daily.   Yes  [provider]  potassium chloride (KLOR-CON) 10 MEQ tablet Take 1 tablet (10 mEq total) by mouth daily. 11/28/21  Yes Bethann Berkshire, MD  sertraline (ZOLOFT) 50 MG tablet Take 50 mg by mouth daily.   Yes [provider]  simethicone (MYLICON) 80 MG chewable tablet Chew 1 tablet (80 mg total) by mouth every 6 (six) hours as needed for flatulence. 11/24/17  Yes Casey Burkitt, MD  sucralfate (CARAFATE) 1 g tablet Take 1 tablet (1 g total) by mouth 4 (four) times daily -  with meals and at bedtime. 01/07/20  Yes Derwood Kaplan, MD      Allergies    Patient has no known allergies.    Review of Systems   Review of Systems  Unable to perform ROS: Dementia  Cardiovascular:  Negative for chest pain.   Physical Exam Updated Vital Signs BP (!) 142/65    Pulse (!) 58    Temp 97.9 F (36.6 C) (Oral)    Resp 17    SpO2 100%  Physical Exam Constitutional:      Appearance: She is well-developed.  HENT:     Head: Normocephalic.     Nose: Nose normal.  Eyes:     General: No scleral icterus.    Conjunctiva/sclera: Conjunctivae normal.  Neck:     Thyroid: No thyromegaly.  Cardiovascular:     Rate and Rhythm: Normal rate and regular rhythm.  Heart sounds: No murmur heard.   No friction rub. No gallop.  Pulmonary:     Breath sounds: No stridor. No wheezing or rales.  Chest:     Chest wall: No tenderness.  Abdominal:     General: There is no distension.     Tenderness: There is no abdominal tenderness. There is no rebound.  Musculoskeletal:     Cervical back: Neck supple.     Comments: Deformed right hip  Lymphadenopathy:     Cervical: No cervical adenopathy.  Skin:    Findings: No erythema or rash.  Neurological:     Mental Status: She is alert.     Motor: No abnormal muscle tone.     Coordination: Coordination normal.     Comments: Alert and oriented to person only  Psychiatric:        Behavior: Behavior normal.    ED Results / Procedures /  Treatments   Labs (all labs ordered are listed, but only abnormal results are displayed) Labs Reviewed  CBC WITH DIFFERENTIAL/PLATELET - Abnormal; Notable for the following components:      Result Value   WBC 13.3 (*)    Neutro Abs 10.2 (*)    Monocytes Absolute 1.2 (*)    Abs Immature Granulocytes 0.34 (*)    All other components within normal limits  BASIC METABOLIC PANEL - Abnormal; Notable for the following components:   Potassium 2.3 (*)    Chloride 96 (*)    Glucose, Bld 129 (*)    Calcium 8.1 (*)    GFR, Estimated 53 (*)    All other components within normal limits    EKG None  Radiology DG Hip [place] W or [place] Pelvis 1 View Right  Result Date: 11/28/2021 CLINICAL DATA:  Status post right hip plasty dislocation reduction. EXAM: DG HIP (WITH OR WITHOUT PELVIS) 1V PORT RIGHT COMPARISON:  Pelvis and hip radiographs earlier today. FINDINGS: The previous dislocation of the femoral component of right hip arthroplasty has been reduced, femoral head component is seated in the acetabular component. No periprosthetic fracture is seen. IMPRESSION: Reduction of right hip arthroplasty dislocation. Electronically Signed   By: Narda Rutherford M.D.   On: 11/28/2021 19:14   DG Hip Unilat W or Wo Pelvis 2-3 Views Right  Result Date: 11/28/2021 CLINICAL DATA:  Fall, hip pain EXAM: DG HIP (WITH OR WITHOUT PELVIS) 2-3V RIGHT COMPARISON:  Right hip x-ray 07/04/2021 FINDINGS: Total right hip prosthesis with superior dislocation of the femoral component. No acute fracture visualized. Bones are osteopenic. IMPRESSION: Superior dislocation of the right hip prosthesis. Electronically Signed   By: Jannifer Hick M.D.   On: 11/28/2021 16:34    Procedures .Sedation  Date/Time: 11/28/2021 11:07 PM Performed by: Bethann Berkshire, MD Authorized by: Bethann Berkshire, MD   Consent:    Consent obtained:  Verbal   Consent given by:  Guardian   Risks discussed:  Allergic reaction, dysrhythmia and  inadequate sedation Universal protocol:    Procedure explained and questions answered to patient or proxy's satisfaction: yes     Relevant documents present and verified: yes     Test results available: yes     Imaging studies available: yes     Required blood products, implants, devices, and special equipment available: yes     Site/side marked: yes     Immediately prior to procedure, a time out was called: yes   Pre-sedation assessment:    Time since last food or drink:  Unknown  ASA classification: class 2 - patient with mild systemic disease     Mallampati score:  I - soft palate, uvula, fauces, pillars visible   Pre-sedation assessments completed and reviewed: airway patency     Pre-sedation assessments completed and reviewed: pre-procedure mental status not reviewed   Immediate pre-procedure details:    Reviewed: vital signs     Verified: bag valve mask available   Procedure details (see MAR for exact dosages):    Preoxygenation:  Nasal cannula   Sedation:  Propofol   Intended level of sedation: deep   Intra-procedure monitoring:  Blood pressure monitoring and cardiac monitor   Intra-procedure events: none     Total Provider sedation time (minutes):  40 Post-procedure details:    Post-sedation assessments completed and reviewed: airway patency     Procedure completion:  Tolerated well, no immediate complications Comments:     Patient was given propofol for sedation.  Patient was adequately sedated with 40 mg of propofol.  Her oxygen sat dropped some but responded well to short episodes of bagging the patient.  She awoke without complications    Medications Ordered in ED Medications  sodium chloride 0.9 % bolus 500 mL (0 mLs Intravenous Stopped 11/28/21 1945)  potassium chloride 10 mEq in 100 mL IVPB (0 mEq Intravenous Stopped 11/28/21 2037)  potassium chloride 10 mEq in 100 mL IVPB (0 mEq Intravenous Stopped 11/28/21 1827)  morphine (PF) 2 MG/ML injection 2 mg (2 mg  Intravenous Given 11/28/21 1759)  propofol (DIPRIVAN) 10 mg/mL bolus/IV push 40 mg (40 mg Intravenous Given 11/28/21 1844)    ED Course/ Medical Decision Making/ A&P  CRITICAL CARE Performed by: Bethann BerkshireJoseph Kannon Baum Total critical care time: 40 minutes Critical care time was exclusive of separately billable procedures and treating other patients. Critical care was necessary to treat or prevent imminent or life-threatening deterioration. Critical care was time spent personally by me on the following activities: development of treatment plan with patient and/or surrogate as well as nursing, discussions with consultants, evaluation of patient's response to treatment, examination of patient, obtaining history from patient or surrogate, ordering and performing treatments and interventions, ordering and review of laboratory studies, ordering and review of radiographic studies, pulse oximetry and re-evaluation of patient's condition.                          Medical Decision Making Amount and/or Complexity of Data Reviewed Labs: ordered. Radiology: ordered.  Risk Prescription drug management.   Patient with a dislocated right hip and hypokalemia.  She was given IV potassium for the hypokalemia and her right hip was relocated by Dr. Rhunette CroftNanavati.  Patient was given conscious sedation by Dr. Estell HarpinZammit    This patient presents to the ED for concern of right hip injury, this involves an extensive number of treatment options, and is a complaint that carries with it a high risk of complications and morbidity.  The differential diagnosis includes fractured right hip or dislocated right hip   Co morbidities that complicate the patient evaluation  Dementia   Additional history obtained:  Additional history obtained from nursing home External records from outside source obtained and reviewed including hospital records   Lab Tests:  I Ordered, and personally interpreted labs.  The pertinent results include:  CBC and chemistry which showed elevated white count 13.3 and low potassium of 2.3   Imaging Studies ordered:  I ordered imaging studies including right hip I independently visualized and interpreted imaging which  showed dislocated right hip I agree with the radiologist interpretation   Cardiac Monitoring:  The patient was maintained on a cardiac monitor.  I personally viewed and interpreted the cardiac monitored which showed an underlying rhythm of: Normal sinus rhythm   Medicines ordered and prescription drug management:  I ordered medication including propofol for sedation to replace hip and potassium IV for the low potassium Reevaluation of the patient after these medicines showed that the patient improved I have reviewed the patients home medicines and have made adjustments as needed   Test Considered:  None   Critical Interventions:  Reducing dislocated hip   Consultations Obtained:  No consult  Problem List / ED Course:  Hypokalemia dislocated right hip   Reevaluation:  After the interventions noted above, I reevaluated the patient and found that they have :improved   Social Determinants of Health:  Lives in a nursing home   Dispostion:  After consideration of the diagnostic results and the patients response to treatment, I feel that the patent would benefit from discharge home with.  Prescription for potassium and follow-up with PCP.  Also patient has given a knee immobilizer and will follow-up with orthopedics for her dislocated hip        Final Clinical Impression(s) / ED Diagnoses Final diagnoses:  Fall, initial encounter  Hypokalemia  Anterior dislocation of right hip, initial encounter [facility])    Rx / DC Orders ED Discharge Orders          Ordered    potassium chloride (KLOR-CON) 10 MEQ tablet  Daily        11/28/21 2303              Bethann Berkshire, MD 11/29/21 1207

## 2021-11-28 NOTE — ED Triage Notes (Signed)
Pt from care facility ([facility]) via [place]. Per EMS pt was being helped to the bathroom and "twisted". EMS reports staff heard something pop. PT has shortening and rotation of the right leg.

## 2021-11-28 NOTE — ED Notes (Signed)
[facility] contacted for transport 

## 2021-11-28 NOTE — ED Notes (Signed)
[facility] at bedside 

## 2021-11-28 NOTE — ED Provider Notes (Signed)
°  Physical Exam  BP 133/66    Pulse (!) 59    Temp 97.9 F (36.6 C) (Oral)    Resp 14    SpO2 99%   Physical Exam  Procedures  Reduction of dislocation  Date/Time: 11/28/2021 9:50 PM Performed by: Derwood Kaplan, MD Authorized by: Derwood Kaplan, MD  Consent: Written consent obtained. Risks and benefits: risks, benefits and alternatives were discussed Consent given by: guardian Patient understanding: patient states understanding of the procedure being performed Patient identity confirmed: arm band Time out: Immediately prior to procedure a "time out" was called to verify the correct patient, procedure, equipment, support staff and site/side marked as required. Preparation: Patient was prepped and draped in the usual sterile fashion. Patient tolerance: patient tolerated the procedure well with no immediate complications    ED Course / MDM    Medical Decision Making Amount and/or Complexity of Data Reviewed Labs: ordered. Radiology: ordered.  Risk Prescription drug management.   Patient was primarily seen by Dr. Estell Harpin. He performed the procedural sedation, while I assisted in getting the hip reduced.  Hip reduction completed without any complication.  Neurovascularly intact.        Derwood Kaplan, MD 11/28/21 2151

## 2021-11-30 NOTE — Progress Notes (Signed)
PATIENT NAME: Melissa Harris DOB: 1927/12/19 MRN: [id number]  PRIMARY CARE PROVIDER: Rinaldo Cloud, NP  RESPONSIBLE PARTY:  Acct ID - Guarantor Home Phone Work Phone Relationship Acct Type  [id number] Melissa, [name]  Self P/F     [address], [place], [place], [place] [address]   COMMUNITY PALLIATIVE CARE RN NOTE     PLAN OF CARE and INTERVENTION:  ADVANCE CARE PLANNING/GOALS OF CARE: comfort CAREGIVER EDUCATION: Safety, PO intake DISEASE STATUS: RN Palliative care visit completed. Met with patient at [facility] care. Spoke with caregiver Laveda Norman. Patient out of bed to common area. Welcomed RN visit. Denies any pain or shortness of breath. Patient able to express understanding that she has decreased appetite. Patient stated that she has some GI discomfort and does not feel like eating. She also voiced that she feels like she has something in her throat. Observed patient at lunch time. She fed herself about  of her bowl of soup but did not want any of the other food. RN from facility present as well as Astronomer. Plan is for them to speak with [facility] with recommendations for a GI consult. Patient's weight is stable. No observed coughing at mealtime, no evidence of distress. Patient verbalized that she was raised in [place] and was of the [employer]. She shared that she had crosses burned on her property because she was Catholic, and the [employer] was open to all nationalities and races.  HISTORY OF PRESENT ILLNESS: This is a 88- year-old female with a diagnosis of Dementia and Osteoarthritis. Palliative care has been asked to follow for additional support, care and complex decision making.   CODE STATUS: DNR PPS: 40% Physical Exam:  General: No acute distress, well nourished Pulmonary: LS CTA, No cough or congestion Cardiac: RRR, no evidence of chest pain, BLE non-pitting edema Neurological: Generalized weakness, cognitive impairment Abdomen: Soft,  non-tender, BS positive.  COMMUNITY PALLIATIVE CARE RN NOTE     PLAN OF CARE and INTERVENTION:  ADVANCE CARE PLANNING/GOALS OF CARE: comfort CAREGIVER EDUCATION: Safety, PO intake DISEASE STATUS: RN Palliative care visit completed. Met with patient at [facility] care. Spoke with caregiver Laveda Norman. Patient out of bed to common area. Welcomed RN visit. Denies any pain or shortness of breath. Patient able to express understanding that she has decreased appetite. Patient stated that she has some GI discomfort and does not feel like eating. She also voiced that she feels like she has something in her throat. Observed patient at lunch time. She fed herself about  of her bowl of soup but did not want any of the other food. RN from facility present as well as Astronomer. Plan is for them to speak with [facility] with recommendations for a GI consult. Patient's weight is stable. No observed coughing at mealtime, no evidence of distress. Patient verbalized that she was raised in [place] and was of the [employer]. She shared that she had crosses burned on her property because she was Catholic, and the [employer] was open to all nationalities and races.  HISTORY OF PRESENT ILLNESS: This is a 88- year-old female with a diagnosis of Dementia and Osteoarthritis. Palliative care has been asked to follow for additional support, care and complex decision making.   CODE STATUS: DNR PPS: 40% Physical Exam:  General: No acute distress, well nourished Pulmonary: LS CTA, No cough or congestion Cardiac: RRR, no evidence of chest pain, BLE non-pitting edema Neurological: Generalized weakness, cognitive impairment Abdomen: Soft, non-tender, BS  positive.       Duration: 75 min     Duration: 75 min       Maxwell Caul RN, BSN   Cornelius Moras, RN

## 2021-12-01 ENCOUNTER — Emergency Department (HOSPITAL_BASED_OUTPATIENT_CLINIC_OR_DEPARTMENT_OTHER)
Admission: EM | Admit: 2021-12-01 | Discharge: 2021-12-01 | Disposition: A | Discharge status: Home / Self Care | Payer: Medicare Other | Attending: Emergency Medicine | Admitting: Emergency Medicine

## 2021-12-01 ENCOUNTER — Other Ambulatory Visit: Payer: Self-pay

## 2021-12-01 ENCOUNTER — Encounter (HOSPITAL_BASED_OUTPATIENT_CLINIC_OR_DEPARTMENT_OTHER): Payer: Self-pay

## 2021-12-01 ENCOUNTER — Emergency Department (HOSPITAL_BASED_OUTPATIENT_CLINIC_OR_DEPARTMENT_OTHER): Payer: Medicare Other | Admitting: Radiology

## 2021-12-01 DIAGNOSIS — Z96641 Presence of right artificial hip joint: Secondary | ICD-10-CM | POA: Insufficient documentation

## 2021-12-01 DIAGNOSIS — Z79899 Other long term (current) drug therapy: Secondary | ICD-10-CM | POA: Insufficient documentation

## 2021-12-01 DIAGNOSIS — M25559 Pain in unspecified hip: Secondary | ICD-10-CM

## 2021-12-01 DIAGNOSIS — M25551 Pain in right hip: Secondary | ICD-10-CM | POA: Diagnosis not present

## 2021-12-01 NOTE — ED Notes (Signed)
X-Ray at bedside.

## 2021-12-01 NOTE — ED Notes (Signed)
ED Provider at bedside. 

## 2021-12-01 NOTE — ED Triage Notes (Signed)
Patient BIB [facility] from [facility] for Leg Pain.  Per [facility], Patient had Hip Dislocation on 02/18. Was reduced at that time and sent back to Facility.  Per Staff, the Patient was endorsing Pain to Same Leg and would like patient assessed.  No Injury or Trauma.   NAD Noted during Triage. No Complaints per Patient. History of Dementia.

## 2021-12-01 NOTE — ED Notes (Signed)
Called [facility] at 752pm, 11 others ahead of PT

## 2021-12-01 NOTE — Discharge Instructions (Addendum)
The patient's splint should remain around her knee to keep her from flexing her hip. She can bear weight as tolerated, otherwise she can use a wheelchair. Please give tylenol for pain as prescribed.

## 2021-12-01 NOTE — ED Notes (Signed)
Report Called to facility. Care handoff given.

## 2021-12-01 NOTE — ED Provider Notes (Signed)
[facility] EMERGENCY DEPT  Provider Note  CSN: [id number] Arrival date & time: 12/01/21 1847  History Chief Complaint  Patient presents with   Leg Pain    Melissa Harris is a 88 y.o. female with history of dementia brought from [facility] memory care. Their staff provides history over the phone. She was recently at [facility] for R hip prosthesis dislocation which was reduced and she was placed in a knee immobilizer at discharge. Staff at ALF report she has been complaining of pain but they aren't entire sure what has been bothering her. They are also confused about the knee immobilizer, thinking it should have been up around her hip. Patient herself denies any symptoms at the time of my evaluation.    Home Medications Prior to Admission medications   Medication Sig Start Date End Date Taking? Authorizing Provider  acetaminophen (TYLENOL) 325 MG tablet Take 2 tablets (650 mg total) by mouth every 6 (six) hours as needed for mild pain (or Fever >/= 101). 12/23/14   Perkins, Alexzandrew L, PA-C  acetaminophen (TYLENOL) 500 MG tablet Take 1,000 mg by mouth 2 (two) times daily.    [provider]  cholecalciferol (VITAMIN D) 25 MCG (1000 UNIT) tablet Take 3,000 Units by mouth daily.    [provider]  divalproex (DEPAKOTE) 125 MG DR tablet Take 125 mg by mouth 2 (two) times daily.    [provider]  docusate sodium (COLACE) 100 MG capsule Take 1 capsule (100 mg total) by mouth 2 (two) times daily. 12/23/14   Perkins, Alexzandrew L, PA-C  memantine (NAMENDA) 5 MG tablet Take 5 mg by mouth 2 (two) times daily.    [provider]  metoprolol tartrate (LOPRESSOR) 25 MG tablet Take 25-50 mg by mouth See admin instructions. Take 25mg  by mouth every morning and  50mg  every evening    [provider]  pantoprazole (PROTONIX) 40 MG tablet Take 40 mg by mouth 2 (two) times daily.    [provider]  potassium chloride (KLOR-CON) 10 MEQ  tablet Take 1 tablet (10 mEq total) by mouth daily. 11/28/21   , MD  sertraline (ZOLOFT) 50 MG tablet Take 50 mg by mouth daily.    [provider]  simethicone (MYLICON) 80 MG chewable tablet Chew 1 tablet (80 mg total) by mouth every 6 (six) hours as needed for flatulence. 11/24/17   Bethann Berkshire, MD  sucralfate (CARAFATE) 1 g tablet Take 1 tablet (1 g total) by mouth 4 (four) times daily -  with meals and at bedtime. 01/07/20   Casey Burkitt, MD     Allergies    Patient has no known allergies.   Review of Systems   Review of Systems Please see HPI for pertinent positives and negatives  Physical Exam BP 113/73 (BP Location: Right Arm)    Pulse 68    Temp 98.7 F (37.1 C) (Oral)    Resp 16    Ht 5\' 7"  (1.702 m)    Wt 79.2 kg    SpO2 96%    BMI 27.35 kg/m   Physical Exam Vitals and nursing note reviewed.  Constitutional:      Appearance: Normal appearance.  HENT:     Head: Normocephalic and atraumatic.     Nose: Nose normal.     Mouth/Throat:     Mouth: Mucous membranes are moist.  Eyes:     Extraocular Movements: Extraocular movements intact.     Conjunctiva/sclera: Conjunctivae  normal.  Cardiovascular:     Rate and Rhythm: Normal rate.  Pulmonary:     Effort: Pulmonary effort is normal.     Breath sounds: Normal breath sounds.  Abdominal:     General: Abdomen is flat.     Palpations: Abdomen is soft.     Tenderness: There is no abdominal tenderness.  Musculoskeletal:        General: No swelling. Normal range of motion.     Cervical back: Neck supple.     Comments: RLE is in knee immobilizer, no tenderness to hip  Skin:    General: Skin is warm and dry.  Neurological:     General: No focal deficit present.     Mental Status: She is alert.  Psychiatric:        Mood and Affect: Mood normal.    ED Results / Procedures / Treatments   EKG None  Procedures Procedures  Medications Ordered in the ED Medications - No data to  display  Initial Impression and Plan  Patient with reported pain at ALF has no complaints here. I personally viewed the images from radiology studies and agree with radiologist interpretation: Xray confirms no change in recent hip dislocation. Will ask RN to explain knee immobilizer use with ALF staff when she gives report. Otherwise patient is cleared to return. Ortho follow up as recommended previously   ED Course       MDM Rules/Calculators/A&P Medical Decision Making Problems Addressed: Hip pain: chronic illness or injury  Amount and/or Complexity of Data Reviewed Radiology: ordered and independent interpretation performed. Decision-making details documented in ED Course.  Risk OTC drugs.    Final Clinical Impression(s) / ED Diagnoses Final diagnoses:  Hip pain    Rx / DC Orders ED Discharge Orders     None        Pollyann Savoy, MD 12/01/21 [phone]

## 2022-01-09 DEATH — deceased
